# Patient Record
Sex: Female | Born: 1950 | Race: White | Hispanic: No | Smoking: Former smoker
Health system: Southern US, Community
[De-identification: ages and names within clinical notes are randomized; demographics above are authoritative.]

## PROBLEM LIST (undated history)

## (undated) DIAGNOSIS — F1911 Other psychoactive substance abuse, in remission: Secondary | ICD-10-CM

## (undated) DIAGNOSIS — I255 Ischemic cardiomyopathy: Secondary | ICD-10-CM

## (undated) DIAGNOSIS — J439 Emphysema, unspecified: Secondary | ICD-10-CM

## (undated) DIAGNOSIS — Z8619 Personal history of other infectious and parasitic diseases: Secondary | ICD-10-CM

## (undated) DIAGNOSIS — B009 Herpesviral infection, unspecified: Secondary | ICD-10-CM

## (undated) DIAGNOSIS — I2119 ST elevation (STEMI) myocardial infarction involving other coronary artery of inferior wall: Secondary | ICD-10-CM

## (undated) DIAGNOSIS — R9439 Abnormal result of other cardiovascular function study: Secondary | ICD-10-CM

## (undated) DIAGNOSIS — Z9861 Coronary angioplasty status: Secondary | ICD-10-CM

## (undated) DIAGNOSIS — I5040 Unspecified combined systolic (congestive) and diastolic (congestive) heart failure: Secondary | ICD-10-CM

## (undated) DIAGNOSIS — Z87891 Personal history of nicotine dependence: Secondary | ICD-10-CM

## (undated) DIAGNOSIS — Z87898 Personal history of other specified conditions: Secondary | ICD-10-CM

## (undated) DIAGNOSIS — I214 Non-ST elevation (NSTEMI) myocardial infarction: Secondary | ICD-10-CM

## (undated) DIAGNOSIS — F418 Other specified anxiety disorders: Secondary | ICD-10-CM

## (undated) DIAGNOSIS — G4700 Insomnia, unspecified: Secondary | ICD-10-CM

## (undated) DIAGNOSIS — I10 Essential (primary) hypertension: Secondary | ICD-10-CM

## (undated) DIAGNOSIS — Z951 Presence of aortocoronary bypass graft: Secondary | ICD-10-CM

## (undated) DIAGNOSIS — J189 Pneumonia, unspecified organism: Secondary | ICD-10-CM

## (undated) DIAGNOSIS — R570 Cardiogenic shock: Secondary | ICD-10-CM

## (undated) DIAGNOSIS — E785 Hyperlipidemia, unspecified: Secondary | ICD-10-CM

## (undated) DIAGNOSIS — I2510 Atherosclerotic heart disease of native coronary artery without angina pectoris: Secondary | ICD-10-CM

## (undated) HISTORY — DX: Presence of aortocoronary bypass graft: Z95.1

## (undated) HISTORY — DX: Abnormal result of other cardiovascular function study: R94.39

## (undated) HISTORY — DX: Emphysema, unspecified: J43.9

## (undated) HISTORY — DX: Personal history of other specified conditions: Z87.898

## (undated) HISTORY — DX: Other psychoactive substance abuse, in remission: F19.11

## (undated) HISTORY — DX: Unspecified combined systolic (congestive) and diastolic (congestive) heart failure: I50.40

## (undated) HISTORY — DX: Essential (primary) hypertension: I10

## (undated) HISTORY — DX: Personal history of nicotine dependence: Z87.891

## (undated) HISTORY — DX: Personal history of other infectious and parasitic diseases: Z86.19

## (undated) HISTORY — DX: Coronary angioplasty status: Z98.61

## (undated) HISTORY — DX: Other specified anxiety disorders: F41.8

## (undated) HISTORY — DX: Ischemic cardiomyopathy: I25.5

## (undated) HISTORY — DX: Cardiogenic shock: R57.0

## (undated) HISTORY — DX: Non-ST elevation (NSTEMI) myocardial infarction: I21.4

## (undated) HISTORY — PX: DIAGNOSTIC LAPAROSCOPY: SUR761

## (undated) HISTORY — DX: ST elevation (STEMI) myocardial infarction involving other coronary artery of inferior wall: I21.19

## (undated) HISTORY — DX: Atherosclerotic heart disease of native coronary artery without angina pectoris: I25.10

---

## 1998-09-05 ENCOUNTER — Ambulatory Visit (HOSPITAL_COMMUNITY): Admission: RE | Admit: 1998-09-05 | Discharge: 1998-09-05 | Payer: Self-pay | Admitting: Otolaryngology

## 1999-08-17 ENCOUNTER — Encounter: Admission: RE | Admit: 1999-08-17 | Discharge: 1999-08-17 | Payer: Self-pay | Admitting: Internal Medicine

## 1999-09-04 ENCOUNTER — Ambulatory Visit (HOSPITAL_COMMUNITY): Admission: RE | Admit: 1999-09-04 | Discharge: 1999-09-04 | Payer: Self-pay

## 1999-10-31 ENCOUNTER — Emergency Department (HOSPITAL_COMMUNITY): Admission: EM | Admit: 1999-10-31 | Discharge: 1999-10-31 | Payer: Self-pay | Admitting: Internal Medicine

## 2000-01-06 ENCOUNTER — Encounter: Admission: RE | Admit: 2000-01-06 | Discharge: 2000-01-06 | Payer: Self-pay | Admitting: Internal Medicine

## 2000-01-06 ENCOUNTER — Emergency Department (HOSPITAL_COMMUNITY): Admission: EM | Admit: 2000-01-06 | Discharge: 2000-01-07 | Payer: Self-pay | Admitting: Emergency Medicine

## 2000-01-11 ENCOUNTER — Ambulatory Visit (HOSPITAL_COMMUNITY): Admission: RE | Admit: 2000-01-11 | Discharge: 2000-01-11 | Payer: Self-pay | Admitting: Emergency Medicine

## 2000-01-11 ENCOUNTER — Encounter: Payer: Self-pay | Admitting: Emergency Medicine

## 2001-05-03 ENCOUNTER — Other Ambulatory Visit: Admission: RE | Admit: 2001-05-03 | Discharge: 2001-05-03 | Payer: Self-pay | Admitting: Obstetrics and Gynecology

## 2002-03-27 ENCOUNTER — Encounter: Payer: Self-pay | Admitting: Obstetrics and Gynecology

## 2002-03-27 ENCOUNTER — Ambulatory Visit (HOSPITAL_COMMUNITY): Admission: RE | Admit: 2002-03-27 | Discharge: 2002-03-27 | Payer: Self-pay | Admitting: Obstetrics and Gynecology

## 2002-09-05 ENCOUNTER — Other Ambulatory Visit: Admission: RE | Admit: 2002-09-05 | Discharge: 2002-09-05 | Payer: Self-pay | Admitting: Obstetrics and Gynecology

## 2003-10-05 DIAGNOSIS — I2510 Atherosclerotic heart disease of native coronary artery without angina pectoris: Secondary | ICD-10-CM

## 2003-10-05 DIAGNOSIS — Z951 Presence of aortocoronary bypass graft: Secondary | ICD-10-CM

## 2003-10-05 HISTORY — DX: Presence of aortocoronary bypass graft: Z95.1

## 2003-10-05 HISTORY — DX: Atherosclerotic heart disease of native coronary artery without angina pectoris: I25.10

## 2003-10-19 ENCOUNTER — Inpatient Hospital Stay (HOSPITAL_COMMUNITY): Admission: EM | Admit: 2003-10-19 | Discharge: 2003-11-02 | Payer: Self-pay | Admitting: Emergency Medicine

## 2003-10-21 ENCOUNTER — Encounter (INDEPENDENT_AMBULATORY_CARE_PROVIDER_SITE_OTHER): Payer: Self-pay | Admitting: *Deleted

## 2003-10-23 HISTORY — PX: CARDIAC CATHETERIZATION: SHX172

## 2003-10-29 HISTORY — PX: CORONARY ARTERY BYPASS GRAFT: SHX141

## 2003-11-29 ENCOUNTER — Encounter: Admission: RE | Admit: 2003-11-29 | Discharge: 2003-11-29 | Payer: Self-pay | Admitting: Cardiothoracic Surgery

## 2003-12-03 DIAGNOSIS — Z9861 Coronary angioplasty status: Secondary | ICD-10-CM

## 2003-12-03 DIAGNOSIS — I2510 Atherosclerotic heart disease of native coronary artery without angina pectoris: Secondary | ICD-10-CM

## 2003-12-03 DIAGNOSIS — I214 Non-ST elevation (NSTEMI) myocardial infarction: Secondary | ICD-10-CM

## 2003-12-03 HISTORY — DX: Atherosclerotic heart disease of native coronary artery without angina pectoris: Z98.61

## 2003-12-03 HISTORY — DX: Atherosclerotic heart disease of native coronary artery without angina pectoris: I25.10

## 2003-12-03 HISTORY — DX: Non-ST elevation (NSTEMI) myocardial infarction: I21.4

## 2003-12-04 ENCOUNTER — Encounter (HOSPITAL_COMMUNITY): Admission: RE | Admit: 2003-12-04 | Discharge: 2004-03-03 | Payer: Self-pay | Admitting: *Deleted

## 2003-12-13 ENCOUNTER — Inpatient Hospital Stay (HOSPITAL_COMMUNITY): Admission: EM | Admit: 2003-12-13 | Discharge: 2003-12-24 | Payer: Self-pay | Admitting: *Deleted

## 2003-12-13 HISTORY — PX: CARDIAC CATHETERIZATION: SHX172

## 2003-12-16 HISTORY — PX: CARDIAC CATHETERIZATION: SHX172

## 2003-12-18 HISTORY — PX: CARDIAC CATHETERIZATION: SHX172

## 2004-02-02 DIAGNOSIS — R570 Cardiogenic shock: Secondary | ICD-10-CM

## 2004-02-02 DIAGNOSIS — I2119 ST elevation (STEMI) myocardial infarction involving other coronary artery of inferior wall: Secondary | ICD-10-CM

## 2004-02-02 DIAGNOSIS — I255 Ischemic cardiomyopathy: Secondary | ICD-10-CM

## 2004-02-02 HISTORY — DX: ST elevation (STEMI) myocardial infarction involving other coronary artery of inferior wall: I21.19

## 2004-02-02 HISTORY — DX: Cardiogenic shock: R57.0

## 2004-02-02 HISTORY — DX: Ischemic cardiomyopathy: I25.5

## 2004-02-18 ENCOUNTER — Inpatient Hospital Stay (HOSPITAL_COMMUNITY): Admission: EM | Admit: 2004-02-18 | Discharge: 2004-03-03 | Payer: Self-pay | Admitting: Emergency Medicine

## 2004-02-18 HISTORY — PX: CARDIAC CATHETERIZATION: SHX172

## 2004-02-24 HISTORY — PX: CARDIAC CATHETERIZATION: SHX172

## 2004-04-24 IMAGING — CR DG CHEST 2V
2 series · 2 of 2 positions shown · non-contrast
Comparison: none

CLINICAL DATA: Coronary artery disease.  Coronary artery bypass grafts done 10/30/03.  Patient is a smoker.  No present chest complaints. 
 TWO VIEW CHEST
 PA and lateral views of the chest are made and are compared to previous study of 10/31/03 from [HOSPITAL] and show significant interval improvement in the basilar aeration with the left pleural effusion to have completely cleared  there are noted the coronary artery bypass grafts.  Heart appears to be within normal limits.  There is generalized peribronchial thickening compatible with chronic bronchitis. 
 IMPRESSION
 Status post coronary artery bypass grafts, marked interval improvement in basilar aeration, and left effusion.

[view not recorded (1 of 2)]
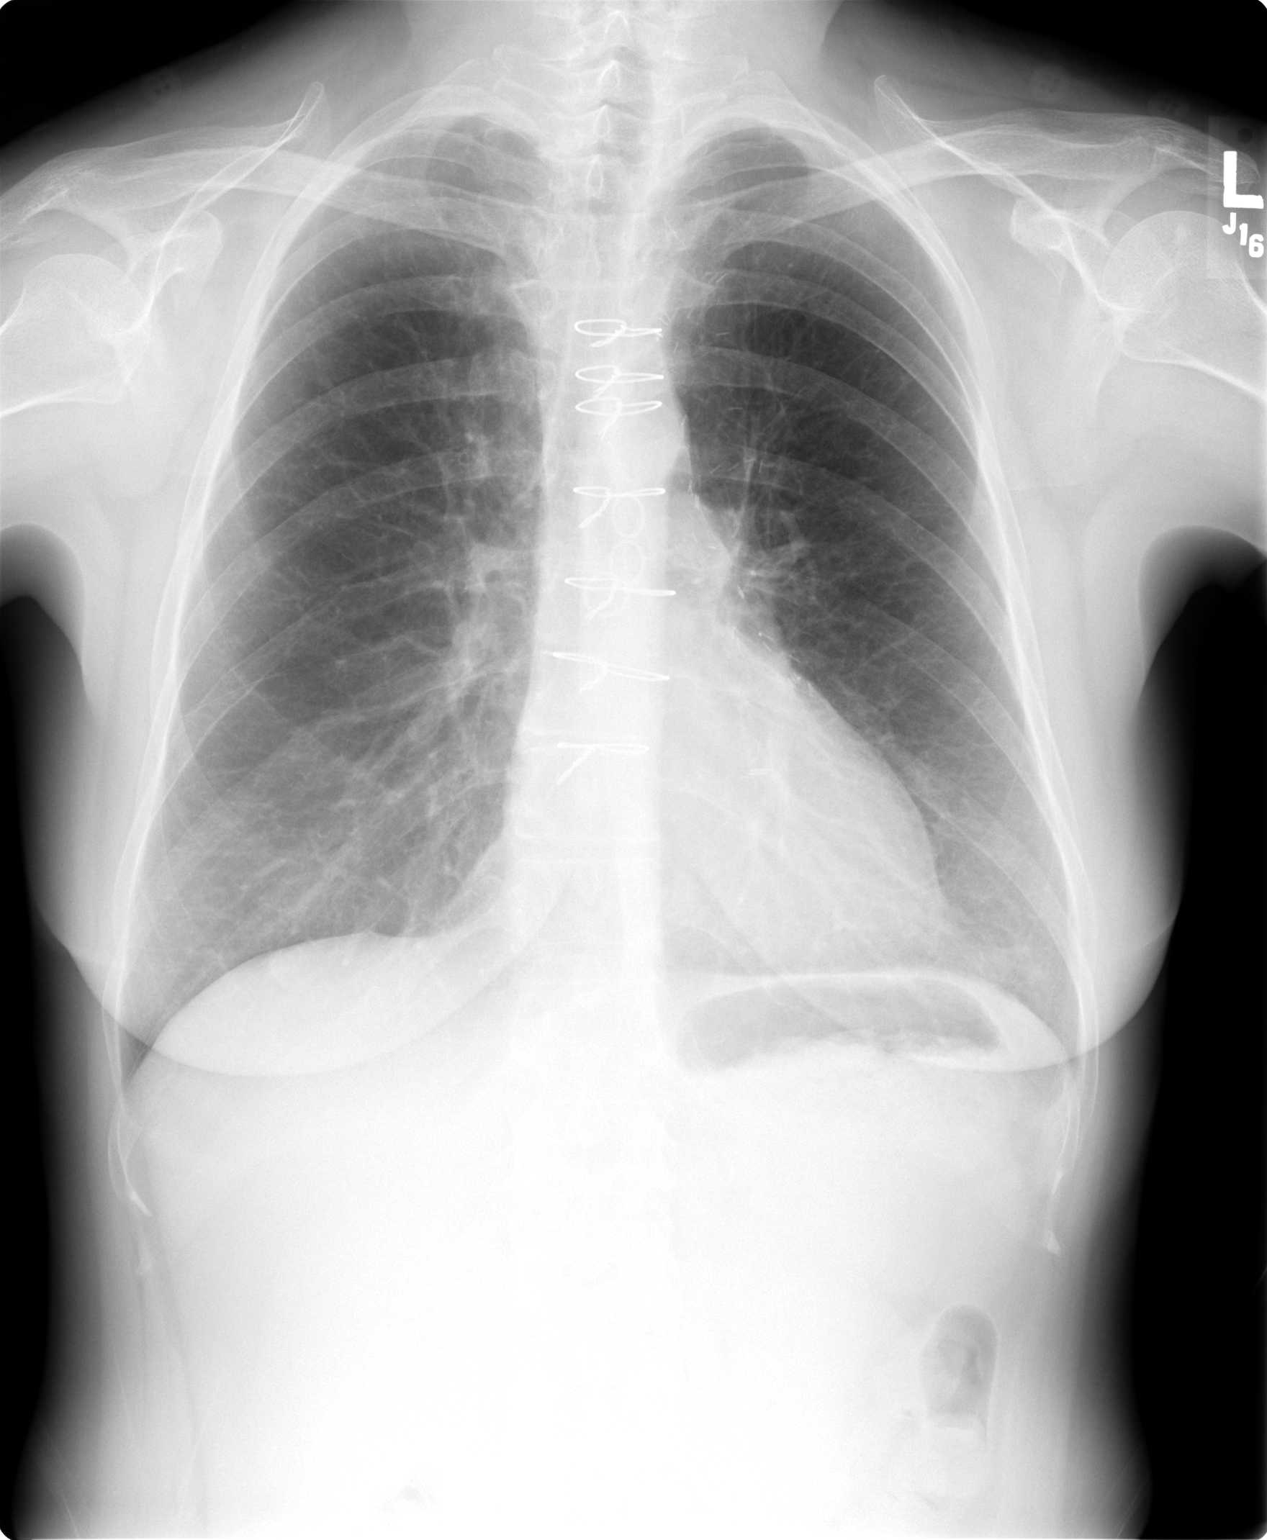

[view not recorded (2 of 2)]
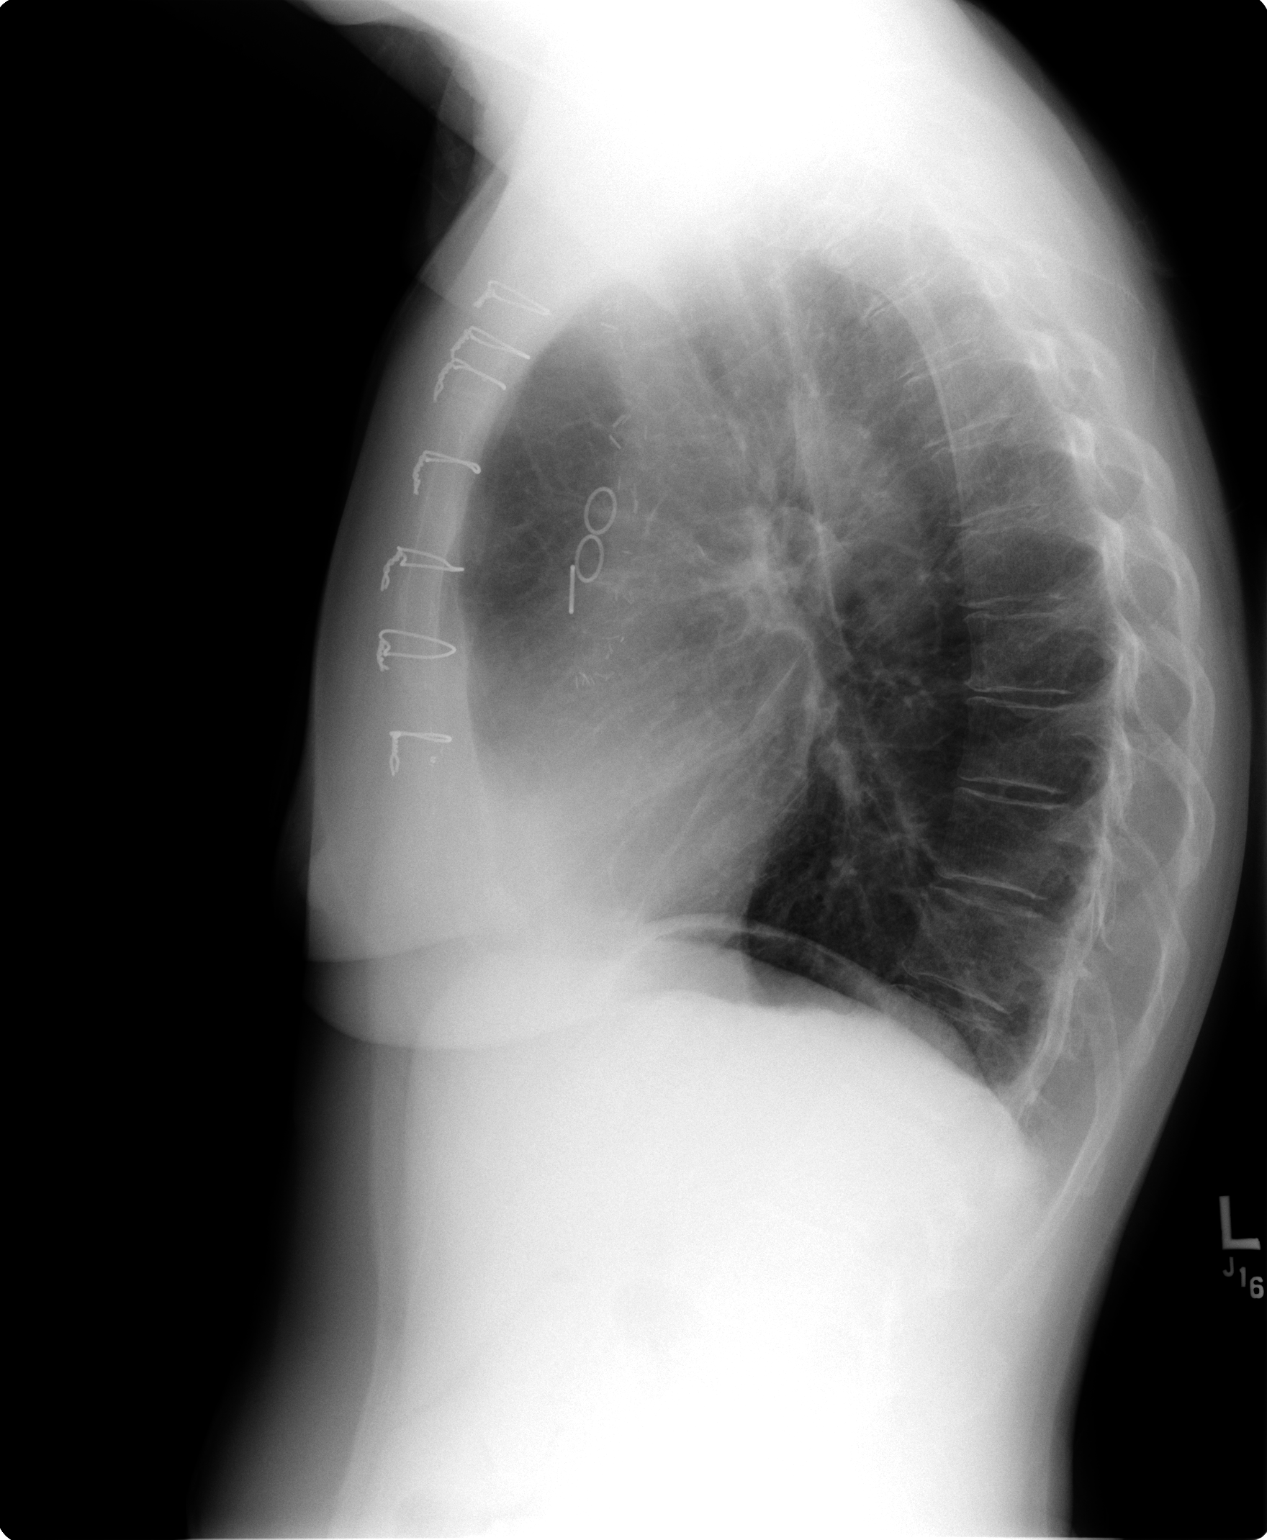

[2 of 2 positions shown; findings below may reference images not displayed]

## 2008-07-25 HISTORY — PX: OTHER SURGICAL HISTORY: SHX169

## 2009-02-11 ENCOUNTER — Ambulatory Visit (HOSPITAL_COMMUNITY): Admission: RE | Admit: 2009-02-11 | Discharge: 2009-02-11 | Payer: Self-pay | Admitting: Gastroenterology

## 2009-03-07 ENCOUNTER — Encounter: Admission: RE | Admit: 2009-03-07 | Discharge: 2009-03-07 | Payer: Self-pay | Admitting: General Surgery

## 2009-04-18 ENCOUNTER — Ambulatory Visit (HOSPITAL_COMMUNITY): Admission: RE | Admit: 2009-04-18 | Discharge: 2009-04-18 | Payer: Self-pay | Admitting: Gastroenterology

## 2009-11-26 ENCOUNTER — Ambulatory Visit (HOSPITAL_COMMUNITY): Admission: RE | Admit: 2009-11-26 | Discharge: 2009-11-26 | Payer: Self-pay | Admitting: Gastroenterology

## 2009-11-27 ENCOUNTER — Encounter: Admission: RE | Admit: 2009-11-27 | Discharge: 2009-11-27 | Payer: Self-pay | Admitting: Family Medicine

## 2010-10-24 ENCOUNTER — Encounter: Payer: Self-pay | Admitting: Obstetrics and Gynecology

## 2011-02-19 NOTE — Cardiovascular Report (Signed)
NAMEFRANCENE, MCERLEAN                     ACCOUNT NO.:  [id number]   MEDICAL RECORD NO.:  [id number]                   PATIENT TYPE:  INP   LOCATION:  2924                                 FACILITY:  [facility]   PHYSICIAN:  Richard A. Alanda Amass, M.D.          DATE OF BIRTH:  [birth year]   DATE OF PROCEDURE:  12/16/2003  DATE OF DISCHARGE:                              CARDIAC CATHETERIZATION   PROCEDURE:  Retrograde central aortic catheterization, right coronary  angiography via Judkins technique, saphenous vein graft angiography, IC  nitroglycerin administration, weight-adjusted heparin, Plavix 150 p.o.,  potassium 40, Pepcid 40 IV, continued integrilin drip, native right coronary  artery cutting balloon atherectomy mid high grade PLA stenosis.   IVUS interrogation RCA Scimed Atlantis pro system, tandem Taxus 2.5/16 DES  stents distal RCA- proximal PLA stenosis, long DES mid RCA 3.0/32 Taxus  stent, recannulization recently occluded (since December 13, 2003), saphenous  vein graft to distal RCA PDA with known graft insertion and PDA stenosis,  balloon dilatation, Angio-Jet thrombectomy, temporary transvenous pacemaker,  DES stent saphenous vein graft - PDA graft insertion across PDA, non-DES  chromium cobalt large stent mid graft and overlapping non-DES large stent  proximal graft stenting.   BRIEF HISTORY:  This 60 year old divorced mother of two with two  grandchildren is an ex-smoker.  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  She  underwent multivessel coronary artery bypass graft by Dr. Donata Clay October 29, 2003 in the setting of severe three-vessel coronary disease.  Unfortunately, she had no veins available.  An LIMA was used for the  LAD  and all vein grafts were cadaver veins with saphenous vein graft to the PDA,  saphenous vein graft to the ramus intermediate and saphenous vein graft to  the diagonal.  She  presented with acute coronary syndrome, non-ST-segment  elevation myocardial infarction.  CPKs in the 2000 range, positive MBs and  was admitted on December 13, 2003.  She underwent angiography by Dr. Allyson Sabal on  December 13, 2003 and was found to have high grade 95% insertion stenosis of  the LIMA to the LAD with 60 and 80% tandem segmental LAD lesions, 95% OD,  95% mid circumflex, tandem 75 and 90 mid acute margin and 90% PLA lesions.  90-95% lesion at the insertion of the vein graft to the PDA extending into  PDA in both directions.  Occluded vein graft to the diagonal and to the  intermediate.   She was seen by Dr. Kathlee Nations Trigt who felt that she was not a candidate  for re-do surgery with no bypass conduits available.  The case was reviewed  with my colleagues including Dr. Allyson Sabal at length and we elected to proceed  with salvage PCI in this setting.  Because of lack of veins, a right arm PIC  line was placed by radiology December 15, 2003.   The  patient was continued on integrilin.  Heparin was held.  She was  continued on aspirin and Plavix and she was brought to the second floor CP  lab in the postabsorptive state.  Informed consent was obtained.  On the day  of the procedure, BUN and creatinine were 5/0.5.  Potassium was low and she  was treated with supplement.   Arterial pressures were monitored throughout the procedure and ranged 110 to  150 mmHg.   The right groin had some minor ecchymosis and bruising and I was able to  enter the CRFA with a single anterior puncture using 18 thin-wall needle and  a 6 French short arterial sheath was inserted.  Native right coronary  angiography was done with 6 [place] side hole guiding catheter.  The patient  was continued on integrilin. She was given weight-adjusted heparin totalling  5000 units, monitoring ACTs which were therapeutic.  She was given  additional 150 of Plavix, 40 of Pepcid IV and 40 of potassium p.o.  Hemoglobin was also monitored  since it was low coming into the procedure.   Native coronary angiography revealed 80-90% mid RCA stenosis, 75-80%  segmental stenosis around the acute margin, tandem and high grade 80%  segmental distal RCA stenosis.  There was some flow to the PDA with a 95%  stenosis proximal to the graft insertion and another 95% after the graft  insertion.  There was no reflux into the graft which was felt to be 100%  occluded.   The graft itself was 100% occluded proximally.   0.014-inch wires were used which were Asahi soft and light wire through the  native RCA procedure.  The RCA was wired beyond the mid PLA lesion.  IVUS  interrogation was done with an Atlantis pro IVUS catheter.  There was  diffuse severe atherosclerotic disease throughout the distal RCA extending  into the PLA branch.  This appeared to be a combination of soft and fibrotic  plaque.  The distal lesion diameter was 1.8 cm proximal to the PLA branch.   The distal reference vessel was approximately 3.2-3.3.  The mid RCA lesions  were 1.6 mm with a reference diameter of 3.4.  The very proximal reference  RCA was 3.6 mm.  This gave [place] a good idea about the diffuse nature of the  disease and reference size of the vessels.  The IVUS was removed which the  patient tolerated well.   The PLA was then treated with cutting atherectomy with a 2.0 x 10 cutting  balloon with dilatation of 5-30 and 6-30.  IC nitroglycerin was given after.  There was a fair result of 80% reduced to less than 30 in the mid PLA and  some spasm proximal to this resolved.   We then stented the distal RCA with a 2.5/12 Taxus stent up to the PDA  origin. There was disease beyond this, so a second 2.5/12 Taxus stent was  deployed.  Both were deployed at 14-16 atmospheres and post dilated with the  balloons.   We then put a long 3.0/32 Taxus stent to cover the tandem mid and lesions around the acute margin.  This was deployed at 11-30 and post dilated at 7-   30.   We then upgraded balloon to 2.75, dilated the distal tandem lesions and then  upgraded the balloon to a 3.25 and dilated the mid stent at 18 atmospheres.   Angiographically, there was good result and good flow throughout the native  coronary artery.  There was still flow into the PDA, but this was restricted  with the stent overriding this.  Prior to this, we had attempted to cross  the PDA through that, but because of the right angle bend of the graft  insertion this was not possible, so it was abandoned.   We then approached the saphenous vein graft to the RCA.  This was intubated  with a side hole right 4 guiding catheter.  Later in the procedure, it was  changed to a nonside hole 6 [place] JR-4 guide.  Combination of wires was  used and finally an Asahi medium 0.014 was used to recannulize the saphenous  vein graft to the PDA.  Buddy wire technique was used.   Dilatation with 2.0 and then 2.5 balloon was not effective in restoring  flow, but we were able to pass another  Asahi light guide wire into the  distal PDA.  We then used the Angio-Jet for extensive thrombectomy of the  PDA after inserted a __________through the CRFV through a 6 French sheath by  modified Seldinger technique without difficulty.   There were 5-6 Angio-Jet runs of 19 to 50 seconds essentially up and down  the graft.  This was effective in restoring flow.  There was a high grade  stenosis as noted at the PDA, PLA area and this was stented with a 2.5/13  Cypher stent.  This was deployed at 12-42, post dilated at 14-28.   There was then high grade stenosis at a right angle bend at the graft.  Another eccentric moderate stenosis of the proximal graft.  The bend of the  graft was not felt to be stable despite all of the above so this was stented  with a long 4.0/18 chromium cobalt Guidant Vision stent which was deployed  at 12-54 and post dilated to 16-47.   There was residual significant stenosis at the  bend despite this and this  may have been part of the culprit.  This required dilatation with a 4.0/8  Quantum balloon and 24 and 18-18 to open this stent up.   There was good result with good flow around the acute margin of this graft.   We then did tandem non-DES 4.0/12 Guidant Vision stents in the proximal  graft to the RCA.  There was irregularity at the ostia.  The stents were  actually a little bit short of the ostia, but good flow and good residual  lumen.  There was good flow throughout the right coronary graft, excellent  result at the insertion site with good filling of the PDA and filling of the  native RCA.   We then did RCA injection native and found good filling of the RCA and  retrograde filling into the distal RCA graft.   The __________final ACT.  Dilatation system was removed.  Final ACT was  greater than 250.  An I-Stat showed a K of 4.3.  H&H of 9.5/28.  The patient tolerated the procedure well and responded quite well to small doses of  Versed totally 4 mg IV for sedation.   This was a very difficult salvage procedure since she has no reoperative  possibilities and there are no clinical conduits.  Hopefully, the native RCA  will stay patent.  I am not sure about the status of the cadaver vein graft  to the PDA.  The PDA is a fairly good size vessel and this will need to be  watched carefully.  She is a candidate  for staged procedure with the  circumflex and decision about intervening in her native LAD and or her LIMA  insertion site.   CATHETERIZATION DIAGNOSES:  1. Arteriosclerotic heart disease.  2. status post multivessel coronary artery bypass graft October 29, 2003 as     outlined above.  3. Cadaver vein grafts all totally occluded on this study, status post SEMI     well preserved left ventricular function acutely.  4. Remote cigarette abuse.  5. 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  No venous     conduits available at the time  of surgery.  6. Hyperlipidemia.  7. Systemic hypertension.  8. Successful salvage right coronary artery/posterior lateral artery cutting     balloon atherectomy.  9. Successful distal right coronary artery tandem DES stenting.  10.      Long DES stenting mid tandem native right coronary artery lesions.  11.      Totally occluded saphenous vein graft to right coronary artery     recannulized treated with Angio-Jet thrombectomy, balloon angioplasty and     subsequent DES stenting at insertion site and non DES stenting in the mid     and proximal graft.  12.      High grade stenosis insertion LIMA to LAD and moderately severe     stenosis segmental proximal left anterior     descending.  13.      Severe stenosis left circumflex.  14.      Ejection fraction approximately 50% at last catheterization December 13, 2003.                                               Richard A. Alanda Amass, M.D.    RAW/MEDQ  D:  12/16/2003  T:  12/17/2003  Job:  [id number]   cc:   Darlin Priestly, M.D.  [phone] [address], [address]  [place]  [place] [address]  Fax: [phone]   Mikey Bussing, M.D.  [address]  [place]  [place] [address]

## 2011-02-19 NOTE — Consult Note (Signed)
[name], [name]                     ACCOUNT NO.:  [id number]   MEDICAL RECORD NO.:  [id number]                   PATIENT TYPE:  INP   LOCATION:  3709                                 FACILITY:  [facility]   PHYSICIAN:  Kerin Perna III, M.D.           DATE OF BIRTH:  Jul 07, 1951   DATE OF CONSULTATION:  10/23/2003  DATE OF DISCHARGE:                                   CONSULTATION   PHYSICIANS:  1. Requesting physician, Darlin Priestly, M.D.  2. Primary care physician, [facility].  3. Consultant, Kerin Perna, M.D.   REASON FOR CONSULTATION:  Severe 3-vessel coronary artery disease with  subendocardial myocardial infarction.   CHIEF COMPLAINT:  Shortness of breath.   HISTORY OF PRESENT ILLNESS:  I was asked to evaluate this 60 year old white  female for potential surgical coronary revascularization for recently  diagnosed severe 3-vessel coronary artery disease. The patient was admitted  to the emergency department on October 18, 2002, with shortness of breath  and low oxygen saturations. She had a productive cough but had no history of  fever. A chest x-ray in the emergency department demonstrated a left lung  infiltrate. Her cardiac enzymes were mildly positive with a CPK-MB of 8.5  nanogram/mL. She was admitted with a diagnosis of pneumonia  and placed on  antibiotics, nebulized bronchodilator therapy. She has been given a dose of  steroids at [facility] 48 hours prior to admission.   Because of the positive cardiac enzymes, a cardiology consultation  was  completed by Dr. Jacinto Halim. He felt the patient probably had some component of  pulmonary edema on her admission which was responsible for the low oxygen  saturation. A 2D echocardiogram  was performed which showed global  hypokinesia without significant mitral regurgitation. A BNP  was checked  which was 150. She underwent  cardiac catheterization today by Dr. Lenise Herald which demonstrated ejection fraction of  40% and left ventricular end  diastolic pressure  of 14 mmHg. She had a proximal 80% LAD stenosis, a 90%  stenosis of the circumflex and a 75% stenosis of the proximal ramus  intermediate. Her right coronary had a 70% stenosis as well.   Based on her 3-vessel coronary artery disease with reduced LV function, she  was felt to be a candidate for surgical revascularization. The patient is  currently stable in her hospital  room on 4 liters of oxygen without chest  pain or shortness of breath.   PAST MEDICAL HISTORY:  1. Chronic obstructive pulmonary disease with active smoking.  2. History of IV drug abuse, heroine, stopped in 1989.  3. Three-vessel coronary artery disease with left ventricular dysfunction.  4. History of alcohol abuse, now free of alcohol.   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 She is single and has a daughter, [name]. She does not  currently drink, but she does smoke1/2  to 1 pack  of cigarettes daily.   FAMILY HISTORY:  Strongly positive for coronary disease. She has 1 sister  and her mother who both have had heart  bypass surgery.   REVIEW OF SYSTEMS:  No recent systemic problems with fever or weight loss.  ENT review is negative for change in vision or difficulty  swallowing.  Cardiac  review is negative for prior known  heart  disease, MI, murmur or  arrhythmia. Pulmonary review is positive for chronic lung disease with  intermittent  episodes of bronchitis treated as an outpatient. She denies  any known  history of nodules or masses on chest x-ray. She denies any  history of thoracic trauma. Abdominal  review is negative for change in  bowel habits, blood per rectum, jaundice or hepatitis. Urologic review is  negative for kidney stones or hematuria. Musculoskeletal review is negative  for fractures or significant soft tissue trauma. She does have some  arthritis in her neck. Endocrine review is negative for  diabetes or thyroid  disease. Vascular review is negative for DVT, TIA or claudication.  Neurologic review is negative for seizure or stroke. She does have some  intermittent  problems with headache and a tremor.   PHYSICAL EXAMINATION:  GENERAL:  She is 5 feet, 2 inches and weighs 125  pounds. General appearance is that of a pleasant middle-aged  white female  in  her hospital  room lying flat in bed following cardiac catheterization,  no distress. She is surrounded by her family.  VITAL SIGNS:  Blood pressure 135/70, pulse 88, sinus temperature 98.4. Room  air saturation is currently unknown, as she is on 4 liters of oxygen with a  saturation of 96%.  HEENT:  Normocephalic, full extraocular movements. Pharynx clear. Dentition  adequate.  NECK:  Without jugular venous distention, mass or carotid bruits.  LYMPH:  Lymphatics reveal no palpable supraclavicular or axillary  adenopathy.  BREASTS:  Examination  is deferred.  LUNGS:  Bilateral expiratory rhonchi.  CARDIAC:  Examination reveals regular rhythm without S3, gallop or murmur.  ABDOMEN:  Soft, nontender without mass.  EXTREMITIES:  No clubbing, cyanosis or edema. Peripheral pulses 2+ in all  extremities.  RECTAL:  Examination is deferred.  SKIN:  Without rash or lesion.  NEUROLOGIC:  Alert and oriented x3 with full motor function.   LABORATORY DATA:  I have reviewed her coronary arteriograms with Dr. Jenne Campus  and agree with his interpretation of 3-vessel coronary artery disease with  moderate reduction in LV function.   RECOMMENDATIONS:  The patient would benefit from surgical revascularization  with bypass grafts to the LAD, ramus, circumflex marginal and posterior  descending artery. Prior to surgery she needs her pulmonary function  optimized and we will proceed with pulmonary function testing and request a  pulmonary  consult. The patient is tentatively scheduled for surgical revascularization of her coronary artery  disease on Tuesday, October 29, 2003. Thank you very much for the consultation.                                               Mikey Bussing, M.D.    PV/MEDQ  D:  10/23/2003  T:  10/23/2003  Job:  [id number]   cc:   CVTS Office   [facility]

## 2011-02-19 NOTE — Cardiovascular Report (Signed)
[name], [name]                     ACCOUNT NO.:  [id number]   MEDICAL RECORD NO.:  [id number]                   PATIENT TYPE:  INP   LOCATION:  3709                                 FACILITY:  [facility]   PHYSICIAN:  Darlin Priestly, M.D.             DATE OF BIRTH:  [birth year]   DATE OF PROCEDURE:  10/23/2003  DATE OF DISCHARGE:                              CARDIAC CATHETERIZATION   PROCEDURE:  1. Left heart catheterization.  2. Coronary angiography.  3. Left ventriculogram.  4. Abdominal aortogram.   CARDIOLOGIST:  Darlin Priestly, M.D.   COMPLICATIONS:  None.   INDICATIONS:  [name] is a 60 year old female with a history of  tobacco use, remote history of heroin use, currently on chronic methadone,  history of ETOH use, history of ongoing tobacco use who was admitted on  October 19, 2003, with increasing shortness of breath.  She did have a 2-D  echocardiogram which revealed moderately decreased LV systolic function of  approximately 30-40% with left bundle noted on her EKG.  She is now referred  for cardiac catheterization to rule out significant disease.   DESCRIPTION OF PROCEDURE:  After giving informed written consent, the  patient was brought to the cardiac catheterization lab where her right and  left groins were shaved, prepped, and draped in the usual sterile fashion.  ECG monitoring was established.  Using modified Seldinger technique, a #6  French sheath was inserted into the right femoral artery. A 6 French  diagnostic catheter was used to perform diagnostic angiography.   CORONARY ANGIOGRAPHY:  1. This revealed a large left main with no significant disease.  2. The LAD is a medium size vessel which courses to the apex as one large     diagonal branch.  The LAD is noted to be diffusely diseased throughout     its proximal and early mid segment of 70%.  The first diagonal is a large     vessel that bifurcates distally and a has a 60% proximal  lesion.  3. The left circumflex is a medium size vessel that courses in the AV groove     and gives rise to obtuse marginal branches.  The AV circumflex is noted     to have a 90% AV groove lesion.  4. The first obtuse marginal is a medium size vessel which bifurcates     distally.  There is a 70% proximal lesion.  5. The second obtuse marginal is a medium size vessel with no significant     disease.  6. The right coronary artery is a large vessel that is dominant and gives     rise to the PDA as well as posterior lateral branch.  The RCA has a 70%     mid vessel and 50% distal stenotic lesion.  The PDA and posterior lateral     branch have no significant disease.   LEFT VENTRICULOGRAM:  The left ventriculogram  reveals moderately depressed  EF of 35-40% with global hypokinesis.   ABDOMINAL AORTOGRAM:  The abdominal aortogram reveals 40% proximal right  renal artery stenosis with normal left renal artery.   HEMODYNAMICS:  Systemic arterial pressures is 164/78, LV systolic pressure  160/90, LVEDP of 14.   CONCLUSION:  1. Significant three vessel coronary artery disease.  2. Mildly depressed left ventricular systolic function  3. 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  4. Systemic hypertension.                                               Darlin Priestly, M.D.    RHM/MEDQ  D:  10/23/2003  T:  10/24/2003  Job:  [id number]   cc:   Lacretia Leigh. Ninetta Lights, M.D.  [address]  [place]  [place] [address]  Fax: [phone]

## 2011-02-19 NOTE — Discharge Summary (Signed)
[name], [name]                     ACCOUNT NO.:  [id number]   MEDICAL RECORD NO.:  [id number]                   PATIENT TYPE:  INP   LOCATION:  2031                                 FACILITY:  [facility]   PHYSICIAN:  Kerin Perna, M.D.               DATE OF BIRTH:  08/04/1951   DATE OF ADMISSION:  10/19/2003  DATE OF DISCHARGE:  11/03/2003                                 DISCHARGE SUMMARY   ADMISSION DIAGNOSIS:  Dyspnea and cough.   PAST MEDICAL HISTORY:  1. COPD with active smoking.  2. 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  3. History of alcohol abuse.   ALLERGIES:  TETRACYCLINE which causes hives, CODEINE which causes angioedema  and itching.   DISCHARGE DIAGNOSES:  1. Chronic obstructive pulmonary disease.  2. History of intravenous drug abuse.  3. 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  4. History of alcohol abuse.  5. Pneumonia.   HISTORY OF PRESENT ILLNESS:  This is a 60 year old white female who  presented to the emergency department on October 18, 2002, with shortness of  breath and low oxygen saturations.  The patient had a productive cough, but  no history of fever.  The patient had been seen at [facility] prior to  admission to the ED for similar symptoms.  A chest x-ray performed in the  emergency department demonstrated a left lung infiltrate.  The patient's  cardiac enzymes were also mildly positive with a CK-MB of 8.5 nanogram/mL.  The patient was admitted with the diagnosis of pneumonia and placed on  antibiotics and nebulizer bronchodilator therapy.  The patient had been  given a dose of steroids at [facility] 48 hours prior to admission.  Due to  the positive cardiac enzymes, a cardiology consultation was performed by Cristy Hilts. Jacinto Halim, M.D.  A two-dimensional echocardiogram was performed which showed  global hypokinesia without significant mitral  regurgitation.  A BNP was 150.  The patient then underwent cardiac catheterization on October 23, 2003, by  Darlin Priestly, M.D. which demonstrated an ejection fraction of 40% as  well as three vessel coronary artery disease.  Dr. Donata Clay of [facility] was then  consulted regarding surgical revascularization.  After examination of the  patient, it was Dr. Zenaida Niece Trigt's opinion that the patient should undergo  coronary artery bypass grafting surgery.  However, prior to surgery, the  patient needed to have her pulmonary function optimized and a pulmonary  consult was obtained.   HOSPITAL COURSE:  The patient was admitted on October 19, 2003, through the  emergency department with a diagnosis of pneumonia which was treated with  Rocephin and Azithromycin. Due to elevated cardiac enzymes, a cardiology  consultation was performed by Dr. Jacinto Halim.  A two-dimensional echocardiogram  was performed which showed global hypokinesia without significant mitral  regurgitation and  a BNP was noted to be elevated at 150.  The patient  underwent cardiac catheterization by Dr. Jenne Campus on October 23, 2003, which  revealed three vessel coronary artery disease and an ejection fraction of  40%. Dr. Donata Clay of CVTS was consulted regarding surgical  revascularization.  After evaluation of the patient, it was his opinion that  the patient should undergo coronary artery bypass grafting after  optimization of her pulmonary function.  The patient was evaluated by  pulmonology and started on Advair, Depo-Medrol, and Guaifenesin.  The  patient's pulmonary status was stabilized and the patient was taken to the  OR on October 29, 2003, for coronary artery bypass graft x4.  Endoscopic  vein harvest was performed on the left thigh, however, the patient's native  vein was not usable and therefore cryovein was used for the graft.  The left  internal mammary artery was grafted to the LAD, cadaveric vein to the ramus,  cadaveric  vein to the PD, and cadaveric vein was grafted to the diagonal.  The patient tolerated the procedure well and was hemodynamically stable  immediately postoperatively.  The patient was extubated without problems and  woke up from anesthesia neurologically intact.  On postoperative day #1, the  patient's vital signs were stable and she was without complaint.  The chest  tubes and invasive lines were discontinued on postoperative day #1 without  complications.  The patient was transferred to unit 2000 and began cardiac  rehab on postoperative day #2.  The patient tolerated this without problems.  The patient has remained stable and has progressed as expected  postoperatively.  On postoperative day #3, the patient was without  complaint, vital signs were stable, and her oxygen saturation was 90% on  room air.  On physical examination, cardiac was regular rate and rhythm.  Lungs were clear to auscultation bilaterally.  The abdomen was soft,  nontender, and nondistended.  There were positive bowel sounds.  The  incisions were healing well and there was no edema present in the  extremities.  The patient is in stable condition and as long as she  continues to progress without problems, will be ready for discharge within  the next one to two days.   LABORATORY DATA:  CBC on November 01, 2003; white count 19.1, hemoglobin  12.7, hematocrit 36.5, platelets 185.  BNP on November 01, 2003; sodium 136,  potassium 3.3, BUN 12, creatinine 0.7, glucose 148.   CONDITION ON DISCHARGE:  Improved.   DISCHARGE MEDICATIONS:  1. The patient is to resume her Methadone HCL 80 mg p.o. daily.  2. Plavix 75 mg p.o. daily.  3. Advair inhaler one puff twice daily.  4. Wellbutrin 150 mg p.o. b.i.d.  5. Toprol XL 50 mg p.o. daily.  6. Altace 2.5 mg p.o. daily.  7. Zocor 20 mg p.o. daily.  8. Aspirin 81 mg p.o. daily.  9. Colace 100 mg two tablets p.o. daily. 10.      Tylox one to two p.o. q.4-6h p.r.n. pain.    ACTIVITY:  No driving, no strenuous activity, and the patient is to continue  daily breathing and walking exercises.   DIET:  Low salt, fat, and cholesterol.   WOUND CARE:  The patient is to shower daily and clean the incisions with  soap and water.  If the incisions become red, swollen, or draining or if the  patient has a fever of 101 degrees Fahrenheit, she is to call the [facility]  Office.  FOLLOW UP:  1. Dr. Donata Clay, the office will call her with that appointment.  It will     be three weeks after discharge.  2. Dr. Jenne Campus two weeks after discharge, the patient is to call for an     appointment.      Pecola Leisure, [place]                      Kerin Perna, M.D.    AY/MEDQ  D:  11/01/2003  T:  11/02/2003  Job:  [id number]   cc:   Darlin Priestly, M.D.  [phone] [address], [address]  [place]  [place] [address]  Fax: [phone]

## 2011-02-19 NOTE — H&P (Signed)
NAMEJESSIAH, WOJNAR                     ACCOUNT NO.:  [id number]   MEDICAL RECORD NO.:  [id number]                   PATIENT TYPE:  INP   LOCATION:  2116                                 FACILITY:  [facility]   PHYSICIAN:  Nanetta Batty, M.D.                DATE OF BIRTH:  Sep 23, 1951   DATE OF ADMISSION:  02/18/2004  DATE OF DISCHARGE:                                HISTORY & PHYSICAL   CHIEF COMPLAINT:  Chest pain and jaw pain.   HISTORY OF PRESENT ILLNESS:  Ms. Downie is a 60 year old female followed  by Dr. Jenne Campus with a history of coronary disease.  She had bypass surgery  October 29, 2003.  Her EF was 40% at that time.  She has a remote history of  IV heroin use, she has been clean since 1990.  Cadaver vein grafts were  used.  Subsequently, she has occluded her vein grafts and also developed an  anastomosis LIMA to LAD stenosis.  She was recently admitted  December 13, 2003 and underwent complex intervention to the native RCA by Dr.  Alanda Amass using two Vision stents and a Cypher stent as well as three Taxus  stents.  She was anemic at that admission and required a transfusion.  Her  EF was 50% at that time.  She was admitted through the emergency room this  morning with recurrent chest pain consistent with unstable angina.  She had  midsternal chest pain which radiated to her jaw and both arms.  This was  noted to improve with nitroglycerin per EMS.  She rated her pain 3/10 on  arrival to the emergency room.   PAST MEDICAL HISTORY:  1. Prior heroin use as noted, she has been clean since 1990 and on     methadone.  2. She has a past history of smoking.  3. She has a history of anxiety/depression.  4. She has treated hyperlipidemia.  5. She has had transient hypotension secondary to medications.   CURRENT MEDICATIONS:  1. Plavix 75 mg a day.  2. Methadone 80 mg a day.  3. Wellbutrin SR 150 twice a day.  4. Protonix 40 mg twice a day.  5. Caduet once a day.  6. Coated  aspirin 81 mg a day.  7. Toprol XL daily.  8. Altace 1.25 mg a day.  9. Imdur 90 mg a day.   She is allergic to TETRACYCLINE and intolerant to CODEINE.   SOCIAL HISTORY:  She is divorced.  She has two children, one daughter is an  ICU nurse at [employer].  The patient lives with her sister.  She  has two grandchildren.  She is an ex-smoker.   FAMILY HISTORY:  Positive for coronary disease and a stroke in her mother  who died at 109.  Father died at 89 with Addison's disease and hypertension  complications.  There is a positive family history of coronary disease and  diabetes in her siblings.   REVIEW OF SYSTEMS:  Essentially unremarkable except for noted above.   PHYSICAL EXAMINATION:  VITAL SIGNS:  126/63, heart rate 73, respirations 20,  temperature 97.9.  GENERAL:  She is a well-developed, well-nourished, somewhat frail female in  no acute distress.  HEENT:  Normocephalic.  Extraocular movements are intact.  Sclera is non-  icteric.  NECK:  Without JVD and without bruit.  CHEST:  Clear to auscultation and percussion.  CARDIAC:  Regular rate and rhythm without murmur, rub, or gallop.  Normal  S1, S2.  ABDOMEN:  Nontender.  No hepatosplenomegaly.  EXTREMITIES:  Without edema.  Distal pulses are intact but faint.  NEUROLOGIC:  Grossly intact.  She is awake, alert, oriented, and  cooperative.  Moves all extremities without obvious deficit.   IMPRESSION:  1. Unstable angina.  2. Known coronary disease with coronary artery bypass grafting December 16, 2003 with subsequent early occlusion of her vein grafts and an occlusion     to the anastomotic site of the LIMA to the LAD with 90% LAD lesion.  3. Subsequent complex intervention to the right coronary artery with     multiple stents December 16, 2003.  4. Remote intravenous drug use, none since 1990, on chronic methadone.  5. Hypertension.  6. Ex-smoker.   PLAN:  The patient was taken emergently to the catheterization  lab by Dr.  Allyson Sabal for catheterization and further evaluation.      Abelino Derrick, P.A.                      Nanetta Batty, M.D.    Lenard Lance  D:  02/18/2004  T:  02/18/2004  Job:  [id number]

## 2011-02-19 NOTE — Cardiovascular Report (Signed)
[name], [name]                     ACCOUNT NO.:  [id number]   MEDICAL RECORD NO.:  [id number]                   PATIENT TYPE:  INP   LOCATION:  2922                                 FACILITY:  [facility]   PHYSICIAN:  Darlin Priestly, M.D.             DATE OF BIRTH:  [birth year]   DATE OF PROCEDURE:  02/24/2004  DATE OF DISCHARGE:                              CARDIAC CATHETERIZATION   PROCEDURES:  1. Right heart catheterization.  2. Left heart catheterization.  3. Coronary angiography.  4. Left ventriculogram.  5. Insertion of intraaortic balloon pump.   ATTENDING PHYSICIAN:  Darlin Priestly, M.D.   COMPLICATIONS:  None.   INDICATIONS:  [name] is a 60 year old female with a history of  coronary artery disease status post coronary bypass surgery in January 2005  with an EF of 40% at that time.  She had history of IV heroin use and we  were unable to use adequate vein graft so she ultimately had cadaveric vein  grafts used.  She did have a LIMA to the LAD.  She was subsequently admitted  in March 2005 where she was noted to have total occlusion of the vein graft  to the diagonal with a patent LIMA to the LAD though she had a 90% lesion at  the insertion of the IMA after the LAD.  The vein graft to the RCA was  subtotally occluded.  Dr. Alanda Amass did stent her native AV groove  circumflex.  He also opened her vein graft to the PLA as well as her native  RCA.  The patient was readmitted on Feb 18, 2004 with acute inferior wall  myocardial infarction.  She underwent repeat catheterization with Dr. Allyson Sabal,  again, revealing a patent LIMA though there was a 90% lesion in the IMA at  the insertion of the LAD.  The LAD had diffuse 70-80% lesion in proximal  territory.  The first diagonal was unprotected and 80% proximal lesion. The  AV groove circumflex stent was patent.  The patient appeared to have  progression of disease proximal to her previously placed Taxus stent as  well  as between the two Taxus stents and in-stent restenosis of the PDA.  She did  undergo placement of three subsequent Taxus stents throughout her distal and  proximal RCA with angioplasty of the in-stent restenosis.  Her EF at the  time was 45-50%.  All her vein grafts were noted to be totally occluded at  the time including the vein graft to the RCA.  She was admitted to the ICU.  However, she remained hypotensive.  On the morning of Feb 24, 2004, she had  acute onset of shortness of breath with pulmonary edema.  She had no chest  pain.  EKG revealed sinus tachycardia with bundle branch block.  The patient  required intubation in the ICU secondary to increasing hypoxia.  She is now  brought for repeat catheterization to reassess  her coronary status.   DESCRIPTION OF OPERATION:  After obtaining informed written consent by the  family, the patient was brought emergently to the cardiac catheterization  lab.  An 8 French venous sheath was inserted in the left femoral vein and a  6 [place] arterial sheath inserted in the left femoral artery.  Next, a 7  [place] Swan-Ganz catheter was then floated into the RA, RV, PA and wedge  position under fluoroscopic guidance and hemodynamic measurement obtained.  6 French diagnostic catheter was then used to perform diagnostic  angiography.  This revealed a medium size left main with no significant  disease.  LAD is a large vessel which coursed to the apex with one diagonal  branch.  The LAD is noted to have diffuse 50% proximal disease prior to the  take off of the first diagonal.  There is long segmental lesion just beyond  the take off of the first diagonal up to 80%.  The first diagonal is a large  vessel which bifurcates distally and has an 80% proximal and mid vessel  stenosis.   The left circumflex is a medium size vessel which courses in the AV groove  and gives rise to two obtuse marginal branches.  There is a stent noted in  the mid AV  groove circumflex which appears to be widely patent.  The first  OM is a small vessel with no significant disease.  The second OM is a medium  size vessel which bifurcates distally and has no significant disease.   There are left-to-right collaterals to the distal PDA and posterior lateral  branch.   The right coronary artery is noted to be totally occluded in its mid segment  within the previously placed stent. As previously stated, there are left-to-  right collaterals to the PDA and posterior lateral branch.   LEFT VENTRICULOGRAM:  Left ventriculogram reveals moderately depressed LV at  30-35% with global hypokinesis.  The function appears to be best preserved  apically.  There does appear to be moderate amount of mitral regurgitation.   HEMODYNAMICS:  RA 15, RV 37/6, PA 35/25, pulmonary capillary wedge pressure  18, systemic arterial pressure 106/78, LV pressure 105/19, LVEDP 20.  Cardiac output 4.8, cardiac index 2.9.  PA saturation 65%, AO saturation 985  on 100% O2 vented.   CONCLUSIONS:  1. Significant two-vessel coronary artery disease with total occlusion of     the mid right coronary artery.  2. Left-to-right collaterals to the apical posterior descending artery and     posterior lateral branch.  3. Moderate-to-severely reduced left ventricular systolic function with wall     motion abnormality as noted above.  4. Moderate mitral regurgitation.  5. Elevated pulmonary capillary wedge pressure with mild pulmonary     hypertension.  6. Elevated LVEDP.  7. Cardiac output 4.8, cardiac index 2.9 by Fick equation.  8. PA saturation 65%, AO saturation 98% on 100% 02.                                               Darlin Priestly, M.D.    RHM/MEDQ  D:  02/24/2004  T:  02/24/2004  Job:  [id number]

## 2011-02-19 NOTE — Cardiovascular Report (Signed)
[name], [name]                     ACCOUNT NO.:  [id number]   MEDICAL RECORD NO.:  [id number]                   PATIENT TYPE:  INP   LOCATION:  2924                                 FACILITY:  [facility]   PHYSICIAN:  Nanetta Batty, M.D.                DATE OF BIRTH:  March 20, 1951   DATE OF PROCEDURE:  12/13/2003  DATE OF DISCHARGE:                              CARDIAC CATHETERIZATION   PROCEDURES PERFORMED:   CARDIOLOGIST:  Nanetta Batty, M.D.   INDICATIONS FOR PROCEDURE:  [name] is a 60 year old white female who  is approximately six weeks status post coronary artery bypass grafting.  She  has a history of hypertension and hyperlipidemia as well as remote illicit  drug use; on methadone currently.  She underwent coronary artery bypass  grafting October 29, 2003 with a LIMA to the LAD, a cadaver vein to the  ramus, cadaver vein to the posterior descending, and to the diagonal branch.  She  saw Dr. Jenne Campus back in the office on February 11th and was doing well.  She awoke this morning with chest pain and presented to [facility]  emergency room.  Her cardiac markers were positive.  There are no  electrocardiographic changes.  She was placed on IV heparin and nitro as  well as Integrilin.  She comes now to the cath lab for angiography and  potential intervention.   PROCEDURE DESCRIPTION:  The patient was brought to the second floor [facility] cardiac cath lab in the post absorptive state.  She was premedicated  with p.o. Valium.  Her right groin was prepped and shaved in the usual  sterile fashion.  One percent Xylocaine was used for local anesthesia.  A 6  French sheath was inserted into the right femoral artery using standard  Seldinger technique.  Six French right and left Judkins diagnostic catheters  as well as a 6 French pigtail catheter and right bypass graft catheter were  used for selective coronary angiography, left ventriculography, selective  IMA  angiography, and selective vein graft angiography respectively.  Omnipaque dye was used for the entirety of the case.  Retrograde aortic and  left ventricular pullback pressures were recorded.   HEMODYNAMIC DATA:  1. Aortic systolic pressure 127, diastolic pressure 69.  2. Left ventricular systolic pressure 126, end-diastolic pressure 5.   ANGIOGRAPHIC DATA:  Selective Coronary Angiography  Left Main:  Left main is normal.   LAD:  The LAD had 60% segmental proximal followed by 70-80% segmental mid  straddling the first diagonal branch, which is a moderate-sized vessel and  had 70% long segmental proximal and mid stenosis.   Left Circumflex:  This vessel had a 95% eccentric stenosis at the junction  of the mid and distal AV groove circ.   Ramus Intermedius Branch:  This is a small-to-medium-sized vessel, which was  diffusely diseased.   Right Coronary Artery:  Dominant vessel with 75% mid followed by  80% distal.  There was a 90% stenosis at the origin of the posterolateral artery.   Vein graft to diagonal was occluded at the aorta.  Vein graft to the ramus  was occluded at the aorta.  Vein graft to the right coronary artery with 90%  insertion stenosis.  LIMA to the LAD; there was a 60% long segmental  stenosis in the mid left subclavian artery straddling the takeoff of the  IMA.  The catheter damped at the origin of the IMA, which had 40-50% long  segmental stenosis.  There was a 95% insertion stenosis at the LAD.  Also of  note was incidentally documented 80% proximal left vertebral artery  stenosis.   VENTRICULOGRAPHIC DATA:  Left Ventriculography:  RAO left ventriculogram was  performed using 20 mL Omnipaque dye at 10 mL per second.  The overall LVEF  ___________ greater than 60% without focal wall motion abnormalities.   IMPRESSION:  [name] unfortunately has severe early graft disease with  two occluded grafts to a diagonal and ramus, and high-grade disease to a   posterior descending artery and left anterior descending with preserved left  ventricular function and positive cardiac markers consistent with non-ST  segment elevation myocardial infarction.   The sheaths will be removed once the ACT falls to 150.  Integrilin will be  continued and heparin will be restarted.  The patient will be started on low-  dose IV nitro.   I will review the films with my colleagues as well as with Dr. Kathlee Nations  Trigt, who has been notified (the performing surgeon).  She may require a  multivessel PCI.   ACT was measured to be greater than 200.  The sheaths were sewn securely in  place.  The patient left the lab in stable condition to an ICU bed.                                               Nanetta Batty, M.D.    JB/MEDQ  D:  12/13/2003  T:  12/14/2003  Job:  [id number]   cc:   Second Floor [facility]   [facility]  [address]  [place], [place]  [address]

## 2011-02-19 NOTE — Op Note (Signed)
NAMESANITA, [name]                     ACCOUNT NO.:  [id number]   MEDICAL RECORD NO.:  [id number]                   PATIENT TYPE:  INP   LOCATION:  2311                                 FACILITY:  [facility]   PHYSICIAN:  Kerin Perna III, M.D.           DATE OF BIRTH:  Jun 27, 1951   DATE OF PROCEDURE:  10/29/2003  DATE OF DISCHARGE:                                 OPERATIVE REPORT   OPERATION:  Coronary artery bypass grafting x 4 (left internal mammary  artery to LAD, saphenous vein graft to diagonal, saphenous vein graft to  ramus intermediate, saphenous vein graft to posterior descending).   PREOPERATIVE DIAGNOSIS:  Class IV congestive heart failure with severe three-  vessel coronary artery disease and moderate reduction in left ventricular  function.   POSTOPERATIVE DIAGNOSIS:  Class IV congestive heart failure with severe  three-vessel coronary artery disease and moderate reduction in left  ventricular function.   ASSISTANT:  Pecola Leisure, PA-C   ANESTHESIA:  General by Dr. Sharee Holster.   INDICATIONS:  The patient is a 60 year old female smoker with significant  COPD and a remote history of IV drug abuse.  She presented in CHF with low  O2 sats.  A 2D echo showed reduced LV function and cardiac catheterization  demonstrated significant three-vessel coronary artery disease.  She was felt  to be a candidate for surgical revascularization.  She was seen  preoperatively by Dr. Sherene Sires of pulmonary medicine, who felt that she was at  high risk for coronary bypass surgery but not contraindicated from her  chronic lung disease.   Prior to surgery I discussed the results of cardiac cath and echo with the  patient and her family.  I discussed the indications and expected benefits  of coronary bypass surgery for treatment of her coronary artery disease.  I  reviewed the alternatives to surgical therapy for treatment of her coronary  artery disease and the expected outcome  of those alternative therapies.  I  discussed with the patient the main aspects of the procedure including the  choice of conduit for grafts, the location of the surgical incisions, the  use of general anesthesia and cardiopulmonary bypass, and the expected  postoperative costs of recovery.  I discussed with her the risks to her of  coronary bypass surgery including risks of MI, CVA, bleeding, blood  transfusion requirement, infection, and death.  She understood these  implications for this surgery and agreed to proceed with the operation as  planned under what I felt was an informed consent.   OPERATIVE FINDINGS:  The patient's saphenous vein was examined from both  thighs and both lower legs and was found to be very thin, small and  inadequate to use as all segments of the vein had thin varicosities which  leaked blood flow under normal systemic pressure.  For that reason,  cryopreserved vein was used of the same blood type as the patient.  The  mammary artery was used and was a small vessel but with excellent flow.  The  heart had mild to moderate global hypokinesia.  Three units of packed cells  were given during bypass to keep the hematocrit at 24% or higher.  The  patient was given one unit of platelets in the operating room at the end of  bypass after protamine was administered for post-pump coagulopathy.   PROCEDURE:  The patient was brought to the operating room and placed supine  on the operating table.  General anesthesia was induced.  The chest, abdomen  and legs were prepped with Betadine and draped as a sterile field.  A  sternal incision was made as the saphenous vein was harvested from both legs  and examined and found to be nonusable.  The mammary artery was harvested as  a pedicle graft from its origin in the subclavian vessels.  Heparin was  administered and the ACT was documented as being therapeutic.  The sternal  retractor was placed.  There were pursestrings placed  in the ascending aorta  and right atrium.  The patient was cannulated and placed on bypass.  The  coronaries were identified for grafting.  The circumflex vessel was too  small to graft.  The mammary artery and cryopreserved vein graft were then  prepared for the distal anastomoses.  A cardioplegia cannula was placed in  the ascending aorta.  The patient was cooled to 30 degrees.  The aortic  cross-clamp was applied; 800 mL of cold blood cardioplegia was delivered to  the aortic root with good cardioplegic arrest and septal temperature dropped  to less than 12 degrees.  Topical iced saline was used to augment myocardial  preservation and a pericardial insulator pad was used to protect the left  phrenic nerve.   The distal coronary anastomoses were then performed.  The first distal  anastomosis was the posterior descending.  This was a small 1.3 mm vessel,  proximal 80% stenosis.  Reverse saphenous vein was sewn end-to-side with  running 7-0 Prolene with good flow through the graft.  The second distal  anastomosis was with ramus intermediate.  This was a 1.4 mm vessel with  proximal 80% stenosis.  Reverse saphenous vein was sewn end-to-side with a  running 7-0 Prolene and good flow through the graft.  Cardioplegia was  redosed.  The third distal anastomosis was the diagonal.  This was a 1.5 mm  vessel with proximal 90% stenosis.  A reverse saphenous vein was sewn end-to-  side with running 7-0 Prolene and good flow through the graft.  The fourth  distal anastomosis was the mid LAD.  This was a 1.5 mm vessel with proximal  90% stenosis.  The left internal mammary artery pedicle was brought through  the opening, created in the left lateral pericardium, was brought down onto  the LAD and sewn end-to-side with running 8-0 Prolene.  There was good flow  through the anastomosis with immediate rise in septal temperature after the release of the pedicle clamp on the mammary artery.  The mammary  pedicle was  secured at the epicardium and the aortic cross-clamp was removed.   The heart was cardioverted back to a regular rhythm.  Using a partial-  occluding clamp, three proximal vein anastomoses were placed on the  ascending aorta using a 4 mm punch and running 6-0 Prolene.  The partial  clamp was removed and the vein grafts were perfused.  Hemostasis was  documented at the proximal  and distal anastomoses.  The patient was rewarmed  and reperfused.  Temporary pacing wires were applied.  The lungs were  expanded.  The ventilator was resumed.  The patient was weaned from bypass  without inotropes with stable blood pressure and cardiac output.  Protamine  was administered without adverse reaction.  The cannulae were removed.  The  mediastinum was irrigated with warm antibiotic irrigation.   The leg incisions were closed in a standard fashion.  The superior  pericardial fat was closed over the aorta and vein grafts.  Two mediastinal  and a left pleural chest tube were placed and brought out through separate  incisions.  The sternum was closed with interrupted steel wire.  The  pectoralis fascia was closed with a running #1 Vicryl.  The subcutaneous and  skin layers were closed with running Vicryl and sterile dressings were  applied.  Total bypass time was 130 minutes with cross-clamp time of 55  minutes.                                               Mikey Bussing, M.D.    PV/MEDQ  D:  10/29/2003  T:  10/30/2003  Job:  [id number]   cc:   Jenne Campus, M.D.  [facility]   [facility] Office

## 2011-02-19 NOTE — Cardiovascular Report (Signed)
[name], [name]                     ACCOUNT NO.:  [id number]   MEDICAL RECORD NO.:  [id number]                   PATIENT TYPE:  INP   LOCATION:  2116                                 FACILITY:  [facility]   PHYSICIAN:  Nanetta Batty, M.D.                DATE OF BIRTH:  June 22, 1951   DATE OF PROCEDURE:  02/18/2004  DATE OF DISCHARGE:                              CARDIAC CATHETERIZATION   INDICATIONS:  Ms. Gunnerson is a 60 year old white female with history of  coronary artery bypass graft October 29, 2003 with cadaveric vein grafts.  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  She was admitted  December 13, 2003 with a non-Q-wave myocardial infarction and underwent cardiac  catheterization by myself revealing two occluded vein grafts, high grade PDA  vein graft disease, high grade native RCA, circumflex, and LAD disease as  well as IMA insertion disease.  Dr. Susa Griffins performed staged  circumflex stenting as well as reconstruction of the PDA vein graft, the  native right and PCI of the IMA insertion.  She was awoken at 5 o'clock this  morning with chest pain and came to [facility] Emergency Room where she was  found to have inferior T-segment elevation.  She was treated with aspirin,  IV heparin, nitro, integrilin and presents now for diagnostic coronary  arteriography.   PROCEDURE DESCRIPTION:  The patient was brought to the second floor [facility] in the postabsorptive state with continuing  chest pain rated at 1/10.  Her right groin was prepped and shaved in the  usual sterile fashion.  1% Xylocaine was used for local anesthesia.  A 6  upgrade to a 7 [place] sheath was inserted into the right femoral artery  using standard Seldinger technique. A 6 French sheath was inserted into the  right femoral vein.  6 French right and left Judkins diagnostic catheter as  well as 6 French pigtail catheter were used for selective coronary  angiography, left ventriculography, vein graft angiography, IMA angiography,  and supravalvular aortography.  Omnipaque dye was used for the entirety of  the case.  Retrograde aorta, left ventricular and pullback pressures were  recorded.   HEMODYNAMICS:  1. Aortic systolic pressure 146, diastolic pressure 75.  2. Left ventricular systolic pressure 145/, end-diastolic pressure 20.   SELECTIVE CORONARY ANGIOGRAPHY:  1. Left main normal.  2. LAD:  The LAD had 60% segmental proximal, followed in tandem by 75%     segmental proximal at the junction of the proximal mid third.  The first     diagonal branch arose in the proximal portion of the LAD and had 80%     segmental proximal stenosis.  There was some competitive flow at the IMA.  3. Left circumflex:  The mid left circumflex stent in the AV groove was     widely patent.  4. Right coronary artery:  Right coronary  artery had 70% segmental proximal     to the previously placed stent, 70-90% segmental at the genu between the     two previously placed stents and 90% at the distal edge of the PLA stent.   Supravalvular aortography revealed all grafts to be occluded.  Attempts at  cannulating the PDA vein graft with a RCB and right Judkins reveal it to be  occluded.   LEFT VENTRICULOGRAPHY:  RAO left ventriculogram was performed using 20 mL of  Omnipaque dye at 10 mL per second.  The overall LVEF is estimated between 45  and 50% with mild inferior hypokinesia.   IMPRESSION:  Ms. Cassells has occluded vein grafts with high grade native  RCA disease as well as LIMA insertion disease.  I believe her culprit lesion  was occlusion of her PDA vein graft leading to a mild inferior wall motion  abnormality.  However, the PDA was a relatively small vessel as is the PLA.  Plans will be for PCI and stenting of the native right coronary artery.   PROCEDURE DESCRIPTION:  The patient received a total of 6500 units of  heparin intravenously.  She will  receive 300 additional mg of p.o. Plavix  and had received aspirin this morning in the ambulance.  The ending ACT was  237.   Using a 7 [place] JR-4  guide catheter with side holes along with an 0.014  190 support guide wire and a 2.5/20 Maverick, PCI was performed on the mid  and distal RCA.  200 mcg of intracoronary artery nitroglycerin was given  several times during the case.  Following this, a 3.0/24 Taxus stent was  deployed in the mid to distal RCA between the two previously placed stents  and deployed at 16 atmospheres.  There was a small uncovered area at the  junction of the distal edge of the Taxus stent which was then covered with a  2.75/8 Taxus.  The distal PLA was dilated with a 2.5/10 cutting balloon at 4  atmospheres and the proximal right was then primarily stented with a 3.0/13  Taxus up to 16 atmospheres.  The previously placed mid 3.0/32 Taxus stent  was post dilated with a 3.25/15 up to 14 atmospheres.   Final angiographic result reveals reduction of proximal 75% to 0, distal 70-  90% to 0 and 90% PLA to 0 with TIMI-3 flow.  The patient tolerated the  procedure well.   OVERALL IMPRESSION:  Successful complex multisite native right coronary  artery percutaneous coronary intervention and stenting using overlapping  Taxus drug-eluting stents and cutting balloon atherectomy.  The patient has  native LAD disease and diagonal disease with a moderately atretic IMA and  high grade insertion stenosis which represents restenosis from the prior  cutting balloon atherectomy performed two months ago.  I believe she would  be a candidate for diagonal LAD PCI and stenting with drug-eluting stents if  this territory becomes ischemic.   The guide wire and catheter were removed.  The sheaths were sewn securely in  place.  Plans will be to remove the sheaths once the ACT falls below 150.  Integrilin will be continued for 24 hours.  The patient will be treated with aspirin and  Plavix.  She left the lab in stable condition.  Nanetta Batty, M.D.    JB/MEDQ  D:  02/18/2004  T:  02/18/2004  Job:  [id number]   cc:   [facility]  [address]  [place], [place] [address]   Darlin Priestly, M.D.  [phone] [address]  [place]  [place] [address]  Fax: [phone]

## 2011-02-19 NOTE — Discharge Summary (Signed)
NAMECRISTIANA, YOCHIM                     ACCOUNT NO.:  [id number]   MEDICAL RECORD NO.:  [id number]                   PATIENT TYPE:  INP   LOCATION:  2012                                 FACILITY:  [facility]   PHYSICIAN:  Nanetta Batty, M.D.                DATE OF BIRTH:  05-06-1951   DATE OF ADMISSION:  12/13/2003  DATE OF DISCHARGE:  12/24/2003                                 DISCHARGE SUMMARY   DISCHARGE DIAGNOSES:  1. Non-ST elevation myocardial infarction.  2. Coronary artery disease.     a. PTCA stent deployment to the distal RCA proximal PLA to the mid RCA        and to the saphenous vein graft to the distal RCA PTA.  Also used was        right coronary cutting balloon arthrectomy mid vessel 12/16/2003.     b. PTCA and stent deployment with CYPHER stent to the high grade mid        circumflex stenosis and PTCA and cutting balloon arthrectomy in the        LIMA insertion.     c. Recent coronary artery bypass grafting October 29, 2003, now with        graft dysfunction.     d. EF 50%.  3. Post procedure anginous resolved currently.  4. Hypotension secondary to medications improved.  5. Remote tobacco abuse.  6. 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  7. Hyperlipidemia.  8. Hypertension stable.  9. 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   DISCHARGE CONDITION:  Improved.   PROCEDURES:  1. 12/16/2003 combined left heart cath with graft visualization, by Dr.     Susa Griffins.  2. 12/16/2003 PTCA stent to the distal RCA, proximal PLA, along DES to the     mid RCA.  Again Taxus balloon dilatation AngioJet thrombectomy to the     saphenous vein graft PDA, graft insertion across the PDA.  Non-DES stent     overlapping with another non-DES large stent proximal graft stenting.  3. 12/16/2003 insertion of temporary pacemaker for procedure only.  4. 12/18/2003 PTCA CYPHER drug-eluding stent to the mid circumflex and PTCA     balloon arthrectomy to the LIMA  insertion site by Dr. Susa Griffins.  5. 12/13/2003 combined left heart cath by Dr. Nanetta Batty with graft     visualization.  6. 12/16/2003 one unit packed cells.  7. 12/19/2003 one unit packed cells.   DISCHARGE MEDICATIONS:  1. Plavix 75 mg 1 daily, do not stop.  2. Methadone 80 mg daily.  3. Wellbutrin SR 150 mg 1 twice a day.  4. Protonix 40 mg 1 twice a day.  5. Zocor 40 mg 1 every evening.  6. Enteric coated aspirin 81 mg daily.  7. Toprol XL 25 mg daily.  8. Altace 1.25 mg daily.  9. Imdur 30 mg daily.  10.      Nitroglycerine sublingual p.r.n.  chest pain.  11.      May use stool softener as needed.  12.      May use Darvocet-N 100 as needed for pain.   DISCHARGE INSTRUCTIONS:  1. No strenuous activity.  2. Low fat, low salt diet.  3. Wash cath site in groin with soap and water, call if any bleeding,     swelling, or drainage.  4. Follow up with Dr. Jenne Campus at 3:15 p.m. on 01/06/2004.   HISTORY OF PRESENT ILLNESS:  Extremely pleasant 60 year old white female was  admitted to [facility] 12/13/2003 by Dr. Nanetta Batty for Dr Jenne Campus  secondary to chest pain.  She has a history of coronary disease and bypass  grafting October 29, 2003 x 4 with a LIMA to the LAD and a cadaver vein  graft.  Other history includes COPD and a history of heroin use, now on  methadone.   The patient was seen and admitted to CCU on IV Heparin and nitroglycerine.  Cardiac markers were positive and she underwent cardiac catheterization  12/13/2003, which revealed two totally occluded grafts to the diag and to  the RI,  the LIMA had a 95% insertion site into the LAD, and the RCA had a  90% stenosis at insertion site, as well as another 90% stenosis.   Plans were made at that time for staged intervention.   PHYSICAL EXAM AT DISCHARGE:  VITAL SIGNS:  Blood pressure 104/55, pulse 89,  respirations 18, temp 97.6. GENERAL:  Alert, oriented white female in no  acute distress, ambulating  in the hall.  HEART:  S1 and S2, Regular rate and  rhythm, normal.  LUNGS:  Clear without rales, rhonchi, or wheezes.  ABDOMEN:  Soft, nontender.  Positive bowel sounds.  EXTREMITIES:  Without edema.   LABORATORY DATA:  Admitting labs, hemoglobin 14.1, hematocrit 40.6, WBC  10.9, platelets 223, neutrophils 65, lymphs 26, mono 7, eos 2, and baso 0.  WBC was at 7 prior to discharge, hemoglobin dropped to a low of 9.9, at  discharge was 11.8, hematocrit 33.9, platelets 238.   Coags on admission:  PT 12.8, INR 0.9, PTT 27.  Heparin less than 0.10.  She  was anticoagulated on Heparin was therapeutic and prior to discharge INR was  1.1.   Chemistry:  Sodium 141, potassium 3.7, chloride 105, CO2 28, glucose 104,  BUN 19, creatinine 0.7, calcium 9.3, magnesium 1.8.  Prior to discharge labs  were stable.  Glucose remains slightly elevated at 115, BUN 23, and  creatinine 0.9.   Cardiac enzymes initially CK 2190, MB 298, and troponin 24.07.  The second  CK 2386, MB 298, and Troponin I of 41, that was the peak.  Came down to a  low of 281 on the 16th with an MB of 20.4, and Troponin I of 5.80.   After chest pain on the 19th, CKs ranged 96-45, but troponins went from  1.22, 1.60, and then down to 0.66.   Lipid panel:  Cholesterol 135, triglycerides 76, HDL 53, LDL 67.   TSH was 1.891.   Iron level was 34, total iron binding capacity 216, iron saturation 16, B12  299, folate 10.6.   Cardiac markers were positive.   Cardiac cath with results as previously stated.   RADIOLOGY:  Admission chest x-ray no evidence of acute cardiopulmonary  disease.   12/15/2003 - PICC placement was done in the right arm and was stable.   12/16/2003 - Right basilar atelectasis and possible small  right effusion.  PICC line in good position, no preliminary edema.   12/17/2003 - Minimal residual right basilar atelectasis, no edema or  infiltrates.  EKGs:  Admission EKG sinus rhythm, left bundle branch block,  abnormal EKG.  On December 14, 2003, sinus rhythm, nonspecific interventricular conduction  delay, bilateral ischemia.  EKGs following remained the same with left  bundle branch block and abnormal EKGs, but no acute changes.   On 12/21/2003 with recurrent chest pain, EKG:  Sinus rhythm, left branch  block but there was no changes.   HOSPITAL COURSE:  A 60 year old white female with recent bypass grafting  including two cadaver vein graft LIMA to the LAD and two saphenous vein  grafts was admitted to [facility] on 12/13/2003 per Dr. Allyson Sabal,  patient  with initially unstable angina, she did in fact have a non-ST elevation MI.  She was taken to the cath lab originally 12/13/2003 and was found to have  graft dysfunction of all four grafts, including the LIMA and the saphenous  vein graft to the RCA.   The two cadaver grafts were totally occluded.   Plans were made staged intervention, patient continued on IV Heparin and  nitroglycerine, and on 12/16/2003 she went to the cath lab with Dr.  Alanda Amass with multiple stents to the RCA saphenous graft and to the native  RCA.   Patient tolerated the procedure, she did become anemic and received one unit  packed cells on 12/16/2003.  On 12/18/2003 she went back to the cath lab for  staged intervention on the LIMA insertion site and to the circumflex.   She tolerated those procedures well, and actually did quite well.  By the  evening of 12/19/2003, she had hypotension, she was given one unit of blood  and p.m. medications had been to be reduced.  Altace was discontinued at  that point and Toprol was cut back.   By the 19th she was gradually improving, IV fluids that she had been given  were cut back to [facility], Altace was restarted at 1.25.  Patient was to transfer  to telemetry bed at that point, but she developed more chest pain but she  was given nitroglycerine and within seconds of giving the nitroglycerine the  pain resolved.  Unsure if there  was a GI component.  Her troponin did  slightly bump up after that episode.  By the next morning she was feeling  well, she continued to walk in the hall, Imdur was added to her medical  regimen, and on 12/24/2003 she was ambulating well.  She is very anxious  about recurrent chest pain.  She was reassured, and explained that at this  point it will be a day-  by-day process.  She will follow-up with Dr. Jenne Campus as an outpatient.  Please note, patient should have a glycohemoglobin done as an outpatient as  her glucose at times here was elevated and mostly due to stress and IV  fluids, but to rule out any diabetes.      Darcella Gasman. Valarie Merino                     Nanetta Batty, M.D.    LRI/MEDQ  D:  12/24/2003  T:  12/26/2003  Job:  [id number]   cc:   Darlin Priestly, M.D.  [address]  [place]  [place] [address]  Fax: [phone]   Mikey Bussing, M.D.  [address]  [place]  [place] [address]  [facility]  [place], [place]

## 2011-02-19 NOTE — Cardiovascular Report (Signed)
[name], [name]                     ACCOUNT NO.:  [id number]   MEDICAL RECORD NO.:  [id number]                   PATIENT TYPE:  INP   LOCATION:  2924                                 FACILITY:  [facility]   PHYSICIAN:  Richard A. Alanda Amass, M.D.          DATE OF BIRTH:  January 15, 1951   DATE OF PROCEDURE:  12/18/2003  DATE OF DISCHARGE:                              CARDIAC CATHETERIZATION   PROCEDURE:  Retrograde central aortic catheterization, selective left  coronary angiography, pre and post IC nitroglycerin administration, weight-  adjusted heparin, continued integrilin infusion, aspirin and Plavix,  predilatation high grade mid circumflex stenosis and subsequent 2.5/13  Cypher drug-eluting stent- successful, selective LIMA angiography pre and  post IC nitroglycerin administration, low pressure balloon percutaneous  transluminal coronary angioplasty and subsequent cutting balloon atherectomy  2.5/10 LIMA insertion - successful, native right coronary angiography,  saphenous vein graft - PDA angiography, LV angiogram RAO, LAO projection.   Please refer to previous catheterization and full report of December 16, 2003.   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  Known coronary disease with three-vessel coronary artery disease,  multivessel coronary artery bypass graft by Dr. Donata Clay October 29, 2003.  Because of absence of venous conduits, LIMA was used and cadaver vein  grafts.  She presented with unstable angina and non-Q-wave myocardial  infarction with CKs greater than 2000, positive MBs on December 13, 2003 and  underwent diagnostic angiography by Dr. Allyson Sabal December 13, 2003 revealing  complex anatomy as outlined with segmental LAD proximal disease, ostial and  proximal DX1 disease, high grade mid circumflex, high grade native right,  distal right and vein graft insertion  into PDA.  Saphenous vein graft to  diagonal and saphenous vein graft to circumflex were occluded.  There was 40-  50% left subclavian stenosis.   The case was reviewed with Dr. Allyson Sabal and Jenne Campus and the patient was turned  down for re-do coronary artery bypass graft because of lack of conduits.  We  then planned for salvage PCI be it complex in this setting.   On December 16, 2003 she had tandem DES stenting of the distal RCA across the  PDA after IVUS interrogation and long DES stenting of the tandem mid RCA  native lesions.  Cutting balloon atherectomy of the mid PLA.   She was found after this to have an occluded graft to the RCA which was  recannulized and treated with rheolytic thrombectomy with Angio-Jet and  subsequent DES stenting of the insertion site to the PDA, the acute bend and  non-DES stenting with a 4.0 large stent chromium cobalt Vision at the acute  margin of the graft and tandem 4.0/12 stents of the proximal portion of the  graft restoring excellent flow.   She remained stable after that and is brought back today for left coronary  angiography and  probable PCI.  She has been continued on integrilin, aspirin  and Plavix and has been stable.   DESCRIPTION OF PROCEDURE:  The left groin was prepped, draped in the usual  manner.  1% Xylocaine was used for local anesthesia.  The LCFA was entered  with an anterior puncture using 18 thin-wall needle and a 6 French short  side arm sheath was inserted.  Left coronary angiography was done pre and  post IC nitroglycerin administration with a 6 French JL-3.5 guiding  catheter.  Weight-adjusted heparin was given at total of 4000 units during  the procedure with continued integrilin infusion, monitoring ACTs which were  therapeutic.  She was given 4 mg of Versed in divided doses for sedation and  intracoronary nitroglycerin of 1000 mcg throughout the procedure.   The circumflex lesion was crossed with a 0.014-inch Asahi soft guide  wire.  The mid circumflex lesion was dilated at low pressure with a 2.25/15 balloon  at 4-30.   There was elastic recoil and suboptimal result and DES stenting was then  done with a 2.5/13 Cypher stent deployed at 9-24 and post dilated at 12-32.  After nitroglycerin IC, stenosis was reduced from 90% eccentric to 0% with  good flow to the distal circumflex artery.   The LIMA was then selectively catheterized with a diagnostic catheter with  several injections obtained and there was dye hang up within the LIMA.  This  was then with high grade stenosis at the LIMA  insertion of 90-95% hazy.  There was associated segmental disease just proximal to the insertion of the  LIMA.  The LIMA was widely patent at this point.   The LIMA had high grade segmental 60-70% proximal disease and tandem before  the diagonal and 80-85% disease beyond the diagonal.  There was good flow to  the distal vessel.  The large diagonal branch had ostial disease of 70-80%  at least, diffuse proximal disease and high grade disease of 80-85% at the  previous saphenous vein graft insertion site before the distal bifurcation  with good vessels beyond this.   There was concern over intervening on the LIMA to the LAD and there was  considerable discussion about whether we should address the native vessel.  Since the LIMA was a good graft except for the insertion site and we  certainly did not want to lose this long term if possible along with the  fact that LAD intervention would require very long probably tandem proximal  LAD stents covering the diagonal.  It was elected to proceed with LIMA  intervention.  This would be done in the general fashion since she is only  60 months postoperative.   The LIMA lesion was crossed with a 0.014-inch Asahi soft wire which was free  in the distal LAD.  The LIMA was then dilated with a 2.25/15 Scimed Maverick balloon at 3-13.  There was residual stenosis at the insertion.  It was  then  redilated again at 8-29 and 10-32.  There was suboptimal result with  haziness and significant elastic recoil particularly at the insertion.  No  evidence of dissection or any perforation.   The balloon was then exchanged for 2.5/10 cutting balloon and the LIMA  across the insertion site was dilated at 4-29 and then 7-36 with good  balloon expansion.  The balloon was pulled back and post nitroglycerin  demonstrated that there was stenosis reduction of 90-95% to less than 30 at  the insertion site with good  flow.  There was significant improvement in  flow through the LIMA into the LAD which was large beyond the graft.  There  was a focal nonflow-limiting dissection in the distal portion of the LIMA  proximal to the insertion site and some mild residual stenosis of about 30-  40%.  Because there was excellent flow and the patient was stable, it was  elected not to pursue any further intervention in this area.  Our thought  was that we try to salvage the LIMA even if we needed to go back later.  The  LIMA was not stented since it was relatively new (two months) graft and also  because it would be very difficult to cover the ostium because of the  insertion angle into LIMA to the LAD completely with stent without  entering  the LAD and we did not want to do that at this point.   Injection showed excellent reflux through the LIMA into the LAD and to the  diagonal branch with good flow.   The system was then removed.   Right coronary angiography was done with a diagnostic 6 French right  coronary demonstrating widely patent native right coronary artery from prior  procedure with widely patent stents, excellent flow to the PLA and  essentially 0% angiographic stenosis.  The mid PLA prior cutting balloon  showed less than 30% narrowing with good flow.   There was some flow to the PDA through the stents which showed stenosis just  before the graft insertion.   There was some  technical difficulty selectively catheterizing the saphenous  vein graft to the PDA and this required a JR-1 diagnostic catheter for  selective injection.  This demonstrated widely patent proximal tandem  stents, widely patent stent at the acute margin and widely patent stent at  the insertion site into the PDA with excellent flow to the PDA and reflux to  the native right coronary.   LV angiogram was then done in the RAO and LAO projection at 25 mL, 14 mL per  second; 20 mL, 12 mL per second demonstrating hypo-akinesis in the mid  anterolateral wall that was mild and hypo-akinesis of the mid posterior wall  that was mild.  EF approximately  50% or greater with no MR.   Catheter was removed.  Side arm sheath was flushed.  The patient was  transferred to the holding area for postoperative care in stable condition.  We planned to continue integrilin for 18 hours, then discontinue this. Continue aspirin and Plavix which she has been on now since admission.   She will be followed up by Dr. Lenise Herald.  It is difficult to tell if  the LIMA insertion cutting balloon intervention will hold up.  At present,  she has significantly improved flow and options include readdressing the  LIMA insertion versus treatment of the proximal segmental LAD and or  diagonal depending upon the clinical and angiographic status.   Outpatient baseline Cardiolite may be helpful in this setting as well.   Continue medical therapy.   CATHETERIZATION DIAGNOSES:  1. Successful mid circumflex high grade stenosis, percutaneous transluminal     angioplasty and subsequent drug-eluting stent.  2. Successful left internal mammary artery insertion, percutaneous     transluminal angioplasty and subsequent cutting balloon atherectomy (see     above).  3. Residual proximal native left anterior descending and diagonal stenosis.  4. Ejection fraction approximately 50%, segmental wall motion abnormalities.  5. Systemic  hypertension.  6.  Hyperlipidemia.  7. Prior complex native right coronary artery and saphenous vein graft to     right coronary artery (cadaver graft intervention December 16, 2003).  See     report.  8. Occluded saphenous vein graft to circumflex and saphenous vein graft to     diagonal.  9. Status post multivessel coronary artery bypass graft October 29, 2003     using left internal mammary artery and     cadaver graft.  10.      Remote heroine use.  No venous conduits available for redo surgery.     On chronic methadone.  11.      Remote smoker.                                               Richard A. Alanda Amass, M.D.    RAW/MEDQ  D:  12/18/2003  T:  12/19/2003  Job:  [id number]   cc:   Darlin Priestly, M.D.  [phone], [address]  [place]  [place] [address]  Fax: [phone]   Mikey Bussing, M.D.  [address]  [place]  [place] [address]

## 2012-01-31 HISTORY — PX: TRANSTHORACIC ECHOCARDIOGRAM: SHX275

## 2012-02-02 DIAGNOSIS — R9439 Abnormal result of other cardiovascular function study: Secondary | ICD-10-CM

## 2012-02-02 HISTORY — DX: Abnormal result of other cardiovascular function study: R94.39

## 2012-02-17 HISTORY — PX: CARDIOVASCULAR STRESS TEST: SHX262

## 2012-04-14 ENCOUNTER — Ambulatory Visit (HOSPITAL_COMMUNITY): Payer: Medicare Other

## 2012-04-14 ENCOUNTER — Encounter (HOSPITAL_COMMUNITY): Payer: Self-pay | Admitting: Gastroenterology

## 2012-04-14 ENCOUNTER — Ambulatory Visit (HOSPITAL_COMMUNITY)
Admission: RE | Admit: 2012-04-14 | Discharge: 2012-04-14 | Disposition: A | Discharge status: Home / Self Care | Payer: Medicare Other | Source: Ambulatory Visit | Attending: Gastroenterology | Admitting: Gastroenterology

## 2012-04-14 ENCOUNTER — Encounter (HOSPITAL_COMMUNITY)
Admission: RE | Disposition: A | Discharge status: Home / Self Care | Payer: Self-pay | Source: Ambulatory Visit | Attending: Gastroenterology

## 2012-04-14 DIAGNOSIS — K838 Other specified diseases of biliary tract: Secondary | ICD-10-CM | POA: Insufficient documentation

## 2012-04-14 DIAGNOSIS — I252 Old myocardial infarction: Secondary | ICD-10-CM | POA: Insufficient documentation

## 2012-04-14 DIAGNOSIS — K829 Disease of gallbladder, unspecified: Secondary | ICD-10-CM | POA: Insufficient documentation

## 2012-04-14 DIAGNOSIS — I10 Essential (primary) hypertension: Secondary | ICD-10-CM | POA: Insufficient documentation

## 2012-04-14 DIAGNOSIS — E785 Hyperlipidemia, unspecified: Secondary | ICD-10-CM | POA: Insufficient documentation

## 2012-04-14 DIAGNOSIS — K8020 Calculus of gallbladder without cholecystitis without obstruction: Secondary | ICD-10-CM | POA: Insufficient documentation

## 2012-04-14 HISTORY — DX: Insomnia, unspecified: G47.00

## 2012-04-14 HISTORY — PX: EUS: SHX5427

## 2012-04-14 HISTORY — DX: Pneumonia, unspecified organism: J18.9

## 2012-04-14 HISTORY — DX: Herpesviral infection, unspecified: B00.9

## 2012-04-14 HISTORY — PX: ESOPHAGOGASTRODUODENOSCOPY: SHX5428

## 2012-04-14 HISTORY — DX: Hyperlipidemia, unspecified: E78.5

## 2012-04-14 SURGERY — UPPER ENDOSCOPIC ULTRASOUND (EUS) LINEAR
Anesthesia: Moderate Sedation

## 2012-04-14 MED ORDER — MIDAZOLAM HCL 10 MG/2ML IJ SOLN
INTRAMUSCULAR | Status: DC | PRN
  Administered 2012-04-14 (×2): 2.5 mg via INTRAVENOUS

## 2012-04-14 MED ORDER — SODIUM CHLORIDE 0.9 % IV SOLN
Freq: Once | INTRAVENOUS | Status: AC
  Administered 2012-04-14: 500 mL via INTRAVENOUS

## 2012-04-14 MED ORDER — MIDAZOLAM HCL 10 MG/2ML IJ SOLN
INTRAMUSCULAR | Status: AC
  Filled 2012-04-14: qty 6

## 2012-04-14 MED ORDER — DIPHENHYDRAMINE HCL 50 MG/ML IJ SOLN
INTRAMUSCULAR | Status: AC
  Filled 2012-04-14: qty 1

## 2012-04-14 MED ORDER — BUTAMBEN-TETRACAINE-BENZOCAINE 2-2-14 % EX AERO
INHALATION_SPRAY | CUTANEOUS | Status: DC | PRN
  Administered 2012-04-14: 2 via TOPICAL

## 2012-04-14 MED ORDER — CIPROFLOXACIN IN D5W 400 MG/200ML IV SOLN
400.0000 mg | Freq: Once | INTRAVENOUS | Status: AC
  Administered 2012-04-14: 400 mg via INTRAVENOUS

## 2012-04-14 MED ORDER — FENTANYL CITRATE 0.05 MG/ML IJ SOLN
INTRAMUSCULAR | Status: DC | PRN
  Administered 2012-04-14 (×2): 25 ug via INTRAVENOUS

## 2012-04-14 MED ORDER — CIPROFLOXACIN IN D5W 400 MG/200ML IV SOLN
INTRAVENOUS | Status: AC
  Filled 2012-04-14: qty 200

## 2012-04-14 MED ORDER — FENTANYL CITRATE 0.05 MG/ML IJ SOLN
INTRAMUSCULAR | Status: AC
  Filled 2012-04-14: qty 6

## 2012-04-14 MED ORDER — GLUCAGON HCL (RDNA) 1 MG IJ SOLR
INTRAMUSCULAR | Status: AC
  Filled 2012-04-14: qty 2

## 2012-04-14 MED ORDER — DIPHENHYDRAMINE HCL 50 MG/ML IJ SOLN
INTRAMUSCULAR | Status: DC | PRN
  Administered 2012-04-14: 25 mg via INTRAVENOUS

## 2012-04-14 NOTE — Op Note (Signed)
[facility] [address] [place], [place]  [address]  ENDOSCOPIC ULTRASOUND PROCEDURE REPORT  PATIENT:  [name], [name]  MR#:  [id number] BIRTHDATE:  05/01/1951  GENDER:  female ENDOSCOPIST:  Jeani Hawking, MD PROCEDURE DATE:  04/14/2012 PROCEDURE:  Upper EUS ASA CLASS:  Class III INDICATIONS:  RUQ pain and Abnormal imaging MEDICATIONS:   Fentanyl 50 mcg IV, Versed 5 mg IV, Benadryl 25 mg IV  DESCRIPTION OF PROCEDURE:   After the risks benefits and alternatives of the procedure were  explained, informed consent was obtained. The patient was then placed in the left, lateral, decubitus postion and IV sedation was administered. Throughout the procedure, the patient's blood pressure, pulse and oxygen saturations were monitored continuously.  Under direct visualization, the 110040 endoscope was introduced through the mouth and advanced to the second portion of the duodenum.  Water was used as necessary to provide an acoustic interface.  Upon completion of the imaging, water was removed and the patient was sent to the recovery room in satisfactory condition. <<PROCEDUREIMAGES>>  FINDINGS:   The CBD was easily identified and it was able to be traced from the ampulla to the hilum of the liver. The CBD was dilated at 10 mm. There was no evidence of any stones, strictures, or masses. There was the possibility of some flecks of sludge. The gallbladder demonstrated an 18 x 19 mm sludge ball versus polyp. This was previously noted on the RUQ U/S. A cursory examination of the pancreas was normal.    The scope was then withdrawn from the patient and the procedure completed.  COMPLICATIONS:  None  ENDOSCOPIC IMPRESSION: 1) Gallbladder sludge ball versus polyp. 2) Dilated CBDat 10 mm without evidence of choledocholthiasis RECOMMENDATIONS: 1) Surgical consultation for cholecystectomy.  ______________________________ Jeani Hawking, MD  n. Rosalie DoctorJeani Hawking at  04/14/2012 12:22 PM  [place] Critchley, [phone]

## 2012-04-14 NOTE — H&P (Signed)
  Reason for Consult:Possible Choledocholithiasis Referring Physician: Jeani Hawking, M.D.  [name] HPI: 61 year old female with RUQ pain.  Her RUQ U/S revealed intra and extrahepatic biliary ductal dilation and her CBD was 9 mm.  Her liver enzymes were ALT 191, AST 355, and AP 153.  She has a sludge ball in her gallbladder.  The repeat liver panel revealed a marked decrease in her liver enzymes.  Past Medical History  Diagnosis Date  . Hypertension   . Myocardial infarction 2005  . Dysrhythmia     afib  . Depression   . Anxiety   . Pneumonia   . Hepatitis A   . Measles   . Chicken pox   . Hyperlipidemia   . Joint pain     both legs  . Herpes   . Insomnia     Past Surgical History  Procedure Date  . Coronary artery bypass graft   . Diagnostic laparoscopy     History reviewed. No pertinent family history.  Social History:  does not have a smoking history on file. She does not have any smokeless tobacco history on file. She reports that she does not drink alcohol or use illicit drugs.  Allergies:  Allergies  Allergen Reactions  . Tetracyclines & Related Rash    Medications: Scheduled:   Continuous:    No results found for this or any previous visit (from the past 24 hour(s)).   No results found.  ROS:  As stated above in the HPI otherwise negative.  There were no vitals taken for this visit.    PE: Gen: NAD, Alert and Oriented HEENT:  [place]/AT, EOMI Neck: Supple, no LAD Lungs: CTA Bilaterally CV: RRR without M/G/R ABM: Soft, NTND, +BS Ext: No C/C/E  Assessment/Plan: 1) ? Choledocholithiasis. 2) Cholelithiasis.   With the drop in her liver enzymes, she may have passed stones.  I will perform an EUS first to see if she has CBD stones before committing to an ERCP.  Plan: EUS+/-ERCP.  [name] D 04/14/2012, 11:16 AM

## 2012-04-17 ENCOUNTER — Encounter (HOSPITAL_COMMUNITY): Payer: Self-pay | Admitting: Gastroenterology

## 2012-05-23 ENCOUNTER — Encounter: Payer: Self-pay | Admitting: Physician Assistant

## 2012-05-23 DIAGNOSIS — F419 Anxiety disorder, unspecified: Secondary | ICD-10-CM | POA: Insufficient documentation

## 2012-05-23 DIAGNOSIS — I2510 Atherosclerotic heart disease of native coronary artery without angina pectoris: Secondary | ICD-10-CM

## 2012-05-23 DIAGNOSIS — I255 Ischemic cardiomyopathy: Secondary | ICD-10-CM | POA: Insufficient documentation

## 2012-05-24 ENCOUNTER — Encounter (INDEPENDENT_AMBULATORY_CARE_PROVIDER_SITE_OTHER): Payer: Self-pay

## 2012-05-24 ENCOUNTER — Ambulatory Visit (INDEPENDENT_AMBULATORY_CARE_PROVIDER_SITE_OTHER): Payer: Medicare Other | Admitting: Surgery

## 2012-05-24 ENCOUNTER — Encounter (INDEPENDENT_AMBULATORY_CARE_PROVIDER_SITE_OTHER): Payer: Self-pay | Admitting: Surgery

## 2012-05-24 VITALS — BP 112/60 | HR 68 | Temp 98.7°F | Resp 16 | Ht 62.0 in | Wt 110.2 lb

## 2012-05-24 DIAGNOSIS — K8050 Calculus of bile duct without cholangitis or cholecystitis without obstruction: Secondary | ICD-10-CM | POA: Insufficient documentation

## 2012-05-24 DIAGNOSIS — K8020 Calculus of gallbladder without cholecystitis without obstruction: Secondary | ICD-10-CM

## 2012-05-24 NOTE — Progress Notes (Signed)
Patient ID: [name], female   DOB: 08-May-1951, 61 y.o.   MRN: [id number]  Chief Complaint  Patient presents with  . Pre-op Exam    eval GB    HPI [name] is a 61 y.o. female.  Patient sent at the request of Dr.Hung for  epigastric pain. She has a history of gallbladder polyp versus done. She underwent an endoscopic ultrasound last month do to common bile duct dilation which showed a polyp versus stone in gallbladder. Last month she had severe epigastric pain and chest pain while the beach. She was evaluated for cardiac disease and this was negative.she is here to discuss possible cholecystectomy. Denies history of fatty food intolerance. She's had a previous attack of pain like this in the past but not as severe. Pain located in the epigastrium and radiates to her chest. Denies nausea and vomiting. HPI  Past Medical History  Diagnosis Date  . Hypertension   . Myocardial infarction 2005  . Dysrhythmia     afib  . Depression   . Anxiety   . Pneumonia   . Hepatitis A   . Measles   . Chicken pox   . Hyperlipidemia   . Joint pain     both legs  . Herpes   . Insomnia   . CHF (congestive heart failure)   . COPD (chronic obstructive pulmonary disease)     Past Surgical History  Procedure Date  . Coronary artery bypass graft   . Diagnostic laparoscopy   . Eus 04/14/2012    Procedure: UPPER ENDOSCOPIC ULTRASOUND (EUS) LINEAR;  Surgeon: Theda Belfast, MD;  Location: [facility] ENDOSCOPY;  Service: Endoscopy;  Laterality: N/A;  . Esophagogastroduodenoscopy 04/14/2012    Procedure: ESOPHAGOGASTRODUODENOSCOPY (EGD);  Surgeon: Theda Belfast, MD;  Location: [facility];  Service: Endoscopy;  Laterality: N/A;    Family History  Problem Relation Age of Onset  . Heart disease Mother   . Stroke Mother   . Heart disease Sister   . Diabetes Sister   . Hypertension Sister   . Diabetes Maternal Aunt     Social History History  Substance Use Topics  . Smoking status:  Former Smoker    Quit date: 10/04/1993  . Smokeless tobacco: Never Used  . Alcohol Use: No    Allergies  Allergen Reactions  . Tetracyclines & Related Rash    Current Outpatient Prescriptions  Medication Sig Dispense Refill  . aspirin 81 MG tablet Take 81 mg by mouth daily.      Marland Kitchen atorvastatin (LIPITOR) 40 MG tablet Take 40 mg by mouth daily.      . carvedilol (COREG) 12.5 MG tablet Take 12.5 mg by mouth 2 (two) times daily with a meal.      . clopidogrel (PLAVIX) 75 MG tablet Take 75 mg by mouth daily.      . digoxin (LANOXIN) 0.05 MG/ML solution Take 0.125 mg by mouth daily.      Marland Kitchen LORazepam (ATIVAN) 0.5 MG tablet       . METHADONE HCL PO Take 60 mg by mouth.      . nitroGLYCERIN (NITROSTAT) 0.3 MG SL tablet Place 0.3 mg under the tongue every 5 (five) minutes as needed.        Review of Systems Review of Systems  Constitutional: Negative.   HENT: Negative.   Eyes: Negative.   Respiratory: Negative.   Cardiovascular: Negative.   Gastrointestinal: Positive for abdominal pain.  Genitourinary: Negative.   Musculoskeletal: Negative.  Neurological: Negative.   Hematological: Negative.   Psychiatric/Behavioral: Negative.     Blood pressure 112/60, pulse 68, temperature 98.7 F (37.1 C), temperature source Temporal, resp. rate 16, height 5\' 2"  (1.575 m), weight 110 lb 3.2 oz (49.986 kg).  Physical Exam Physical Exam  Constitutional: She is oriented to person, place, and time. She appears well-developed and well-nourished.  HENT:  Head: Normocephalic and atraumatic.  Eyes: EOM are normal. Pupils are equal, round, and reactive to light.  Neck: Normal range of motion. Neck supple.  Cardiovascular: Normal rate and regular rhythm.   Pulmonary/Chest: Effort normal and breath sounds normal.  Abdominal: Soft. Bowel sounds are normal. There is no tenderness.  Musculoskeletal: Normal range of motion.  Neurological: She is alert and oriented to person, place, and time.  Skin:  Skin is warm and dry.  Psychiatric: She has a normal mood and affect. Her behavior is normal. Judgment and thought content normal.    Data Reviewed EUS AND U/S ABDOMEN  Assessment    BILIARY COLIC SECONDARY TO POLYP VS STONE IN GALLBLADDER    Plan   LAPAROSCOPIC CHOLECYSTECTOMY AND CHOLANGIOGRAM The procedure has been discussed with the patient. Operative and non operative treatments have been discussed. Risks of surgery include bleeding, infection,  Common bile duct injury,  Injury to the stomach,liver, colon,small intestine, abdominal wall,  Diaphragm,  Major blood vessels,  And the need for an open procedure.  Other risks include worsening of medical problems, death,  DVT and pulmonary embolism, and cardiovascular events.   Medical options have also been discussed. The patient has been informed of long term expectations of surgery and non surgical options,  The patient agrees to proceed.   Success rates for surgery are 80 % for resolution of symptoms.Will need cardiac clearance.       Yovanni Frenette A. 05/24/2012, 12:30 PM

## 2012-05-24 NOTE — Patient Instructions (Signed)
Laparoscopic Cholecystectomy Laparoscopic cholecystectomy is surgery to remove the gallbladder. The gallbladder is located slightly to the right of center in the abdomen, behind the liver. It is a concentrating and storage sac for the bile produced in the liver. Bile aids in the digestion and absorption of fats. Gallbladder disease (cholecystitis) is an inflammation of your gallbladder. This condition is usually caused by a buildup of gallstones (cholelithiasis) in your gallbladder. Gallstones can block the flow of bile, resulting in inflammation and pain. In severe cases, emergency surgery may be required. When emergency surgery is not required, you will have time to prepare for the procedure. Laparoscopic surgery is an alternative to open surgery. Laparoscopic surgery usually has a shorter recovery time. Your common bile duct may also need to be examined and explored. Your caregiver will discuss this with you if he or she feels this should be done. If stones are found in the common bile duct, they may be removed. LET YOUR CAREGIVER KNOW ABOUT:  Allergies to food or medicine.   Medicines taken, including vitamins, herbs, eyedrops, over-the-counter medicines, and creams.   Use of steroids (by mouth or creams).   Previous problems with anesthetics or numbing medicines.   History of bleeding problems or blood clots.   Previous surgery.   Other health problems, including diabetes and kidney problems.   Possibility of pregnancy, if this applies.  RISKS AND COMPLICATIONS All surgery is associated with risks. Some problems that may occur following this procedure include:  Infection.   Damage to the common bile duct, nerves, arteries, veins, or other internal organs such as the stomach or intestines.   Bleeding.   A stone may remain in the common bile duct.  BEFORE THE PROCEDURE  Do not take aspirin for 3 days prior to surgery or blood thinners for 1 week prior to surgery.   Do not eat or  drink anything after midnight the night before surgery.   Let your caregiver know if you develop a cold or other infectious problem prior to surgery.   You should be present 60 minutes before the procedure or as directed.  PROCEDURE  You will be given medicine that makes you sleep (general anesthetic). When you are asleep, your surgeon will make several small cuts (incisions) in your abdomen. One of these incisions is used to insert a small, lighted scope (laparoscope) into the abdomen. The laparoscope helps the surgeon see into your abdomen. Carbon dioxide gas will be pumped into your abdomen. The gas allows more room for the surgeon to perform your surgery. Other operating instruments are inserted through the other incisions. Laparoscopic procedures may not be appropriate when:  There is major scarring from previous surgery.   The gallbladder is extremely inflamed.   There are bleeding disorders or unexpected cirrhosis of the liver.   A pregnancy is near term.   Other conditions make the laparoscopic procedure impossible.  If your surgeon feels it is not safe to continue with a laparoscopic procedure, he or she will perform an open abdominal procedure. In this case, the surgeon will make an incision to open the abdomen. This gives the surgeon a larger view and field to work within. This may allow the surgeon to perform procedures that sometimes cannot be performed with a laparoscope alone. Open surgery has a longer recovery time. AFTER THE PROCEDURE  You will be taken to the recovery area where a nurse will watch and check your progress.   You may be allowed to go home   the same day.   Do not resume physical activities until directed by your caregiver.   You may resume a normal diet and activities as directed.  Document Released: 09/20/2005 Document Revised: 09/09/2011 Document Reviewed: 03/05/2011 [employer] Patient Information 2012 [employer].  Laparoscopic Cholecystectomy Care  After These instructions give you information on caring for yourself after your procedure. Your doctor may also give you more specific instructions. Call your doctor if you have any problems or questions after your procedure. HOME CARE  Change your bandages (dressings) as told by your doctor.   Keep the wound dry and clean. Wash the wound gently with soap and water. Pat the wound dry with a clean towel.   Do not take baths, swim, or use hot tubs for 10 days, or as told by your doctor.   Only take medicine as told by your doctor.   Eat a normal diet as told by your doctor.   Do not lift anything heavier than 25 pounds (11.5 kg), or as told by your doctor.   Do not play contact sports for 1 week, or as told by your doctor.  GET HELP RIGHT AWAY IF:   Your wound is red, puffy (swollen), or painful.   You have yellowish-white fluid (pus) coming from the wound.   You have fluid draining from the wound for more than 1 day.   You have a bad smell coming from the wound.   Your wound breaks open.   You have a rash.   You have trouble breathing.   You have chest pain.   You have a bad reaction to your medicine.   You have a fever.   You have pain in the shoulders (shoulder strap areas).   You feel dizzy or pass out (faint).   You have severe belly (abdominal) pain.   You feel sick to your stomach (nauseous) or throw up (vomit) for more than 1 day.  MAKE SURE YOU:  Understand these instructions.   Will watch your condition.   Will get help right away if you are not doing well or get worse.  Document Released: 06/29/2008 Document Revised: 09/09/2011 Document Reviewed: 03/09/2011 [employer] Patient Information 2012 [employer]. 

## 2012-06-15 ENCOUNTER — Encounter (INDEPENDENT_AMBULATORY_CARE_PROVIDER_SITE_OTHER): Payer: Self-pay

## 2012-06-15 ENCOUNTER — Other Ambulatory Visit (INDEPENDENT_AMBULATORY_CARE_PROVIDER_SITE_OTHER): Payer: Self-pay | Admitting: Surgery

## 2012-06-17 ENCOUNTER — Ambulatory Visit (INDEPENDENT_AMBULATORY_CARE_PROVIDER_SITE_OTHER): Payer: Medicare Other | Admitting: Internal Medicine

## 2012-06-17 ENCOUNTER — Ambulatory Visit: Payer: Medicare Other

## 2012-06-17 VITALS — BP 95/60 | HR 59 | Temp 97.9°F | Resp 16 | Ht 60.75 in | Wt 111.0 lb

## 2012-06-17 DIAGNOSIS — R062 Wheezing: Secondary | ICD-10-CM

## 2012-06-17 DIAGNOSIS — Z91041 Radiographic dye allergy status: Secondary | ICD-10-CM

## 2012-06-17 DIAGNOSIS — R0602 Shortness of breath: Secondary | ICD-10-CM

## 2012-06-17 DIAGNOSIS — J4489 Other specified chronic obstructive pulmonary disease: Secondary | ICD-10-CM

## 2012-06-17 DIAGNOSIS — J449 Chronic obstructive pulmonary disease, unspecified: Secondary | ICD-10-CM

## 2012-06-17 MED ORDER — IPRATROPIUM BROMIDE 0.02 % IN SOLN
0.5000 mg | Freq: Once | RESPIRATORY_TRACT | Status: AC
  Administered 2012-06-17: 0.5 mg via RESPIRATORY_TRACT

## 2012-06-17 MED ORDER — PREDNISONE 10 MG PO TABS: ORAL_TABLET | ORAL | Status: DC

## 2012-06-17 MED ORDER — METHYLPREDNISOLONE ACETATE 80 MG/ML IJ SUSP
80.0000 mg | Freq: Once | INTRAMUSCULAR | Status: AC
  Administered 2012-06-17: 80 mg via INTRAMUSCULAR

## 2012-06-17 MED ORDER — CETIRIZINE HCL 10 MG PO TABS: 10.0000 mg | ORAL_TABLET | Freq: Every day | ORAL | Status: DC

## 2012-06-17 MED ORDER — ALBUTEROL SULFATE HFA 108 (90 BASE) MCG/ACT IN AERS: 2.0000 | INHALATION_SPRAY | Freq: Four times a day (QID) | RESPIRATORY_TRACT | Status: AC | PRN

## 2012-06-17 MED ORDER — ALBUTEROL SULFATE (2.5 MG/3ML) 0.083% IN NEBU
2.5000 mg | INHALATION_SOLUTION | Freq: Once | RESPIRATORY_TRACT | Status: AC
  Administered 2012-06-17: 2.5 mg via RESPIRATORY_TRACT

## 2012-06-17 NOTE — Patient Instructions (Signed)
Chronic Obstructive Pulmonary Disease Chronic obstructive pulmonary disease (COPD) is a condition in which airflow from the lungs is restricted. The lungs can never return to normal, but there are measures you can take which will improve them and make you feel better. CAUSES   Smoking.   Exposure to secondhand smoke.   Breathing in irritants (pollution, cigarette smoke, strong smells, aerosol sprays, paint fumes).   History of lung infections.  TREATMENT  Treatment focuses on making you comfortable (supportive care). Your caregiver may prescribe medications (inhaled or pills) to help improve your breathing. HOME CARE INSTRUCTIONS   If you smoke, stop smoking.   Avoid exposure to smoke, chemicals, and fumes that aggravate your breathing.   Take antibiotic medicines as directed by your caregiver.   Avoid medicines that dry up your system and slow down the elimination of secretions (antihistamines and cough syrups). This decreases respiratory capacity and may lead to infections.   Drink enough water and fluids to keep your urine clear or pale yellow. This loosens secretions.   Use humidifiers at home and at your bedside if they do not make breathing difficult.   Receive all protective vaccines your caregiver suggests, especially pneumococcal and influenza.   Use home oxygen as suggested.   Stay active. Exercise and physical activity will help maintain your ability to do things you want to do.   Eat a healthy diet.  SEEK MEDICAL CARE IF:   You develop pus-like mucus (sputum).   Breathing is more labored or exercise becomes difficult to do.   You are running out of the medicine you take for your breathing.  SEEK IMMEDIATE MEDICAL CARE IF:   You have a rapid heart rate.   You have agitation, confusion, tremors, or are in a stupor (family members may need to observe this).   It becomes difficult to breathe.   You develop chest pain.   You have a fever.  MAKE SURE YOU:    Understand these instructions.   Will watch your condition.   Will get help right away if you are not doing well or get worse.  Document Released: 06/30/2005 Document Revised: 09/09/2011 Document Reviewed: 11/20/2010 [facility] Patient Information 2012 [place], [place].Allergic Rhinitis Allergic rhinitis is when the mucous membranes in the nose respond to allergens. Allergens are particles in the air that cause your body to have an allergic reaction. This causes you to release allergic antibodies. Through a chain of events, these eventually cause you to release histamine into the blood stream (hence the use of antihistamines). Although meant to be protective to the body, it is this release that causes your discomfort, such as frequent sneezing, congestion and an itchy runny nose.  CAUSES  The pollen allergens may come from grasses, trees, and weeds. This is seasonal allergic rhinitis, or "hay fever." Other allergens cause year-round allergic rhinitis (perennial allergic rhinitis) such as house dust mite allergen, pet dander and mold spores.  SYMPTOMS   Nasal stuffiness (congestion).   Runny, itchy nose with sneezing and tearing of the eyes.   There is often an itching of the mouth, eyes and ears.  It cannot be cured, but it can be controlled with medications. DIAGNOSIS  If you are unable to determine the offending allergen, skin or blood testing may find it. TREATMENT   Avoid the allergen.   Medications and allergy shots (immunotherapy) can help.   Hay fever may often be treated with antihistamines in pill or nasal spray forms. Antihistamines block the effects of  histamine. There are over-the-counter medicines that may help with nasal congestion and swelling around the eyes. Check with your caregiver before taking or giving this medicine.  If the treatment above does not work, there are many new medications your caregiver can prescribe. Stronger medications may be used if initial measures  are ineffective. Desensitizing injections can be used if medications and avoidance fails. Desensitization is when a patient is given ongoing shots until the body becomes less sensitive to the allergen. Make sure you follow up with your caregiver if problems continue. SEEK MEDICAL CARE IF:   You develop fever (more than 100.5 F (38.1 C).   You develop a cough that does not stop easily (persistent).   You have shortness of breath.   You start wheezing.   Symptoms interfere with normal daily activities.  Document Released: 06/15/2001 Document Revised: 09/09/2011 Document Reviewed: 12/25/2008 [facility] Patient Information 2012 [place], [place].

## 2012-06-17 NOTE — Progress Notes (Signed)
  Subjective:    Patient ID: [name], female    DOB: [birth year], 61 y.o.   MRN: [id number]  HPI Wheezing and sob gradually getting worse over last 1-2 weeks maybe longer. Hx of copd dx by Dr. Hal Hope, quit smoking 2005 at time of mi. Saw cardiologist last week and heart was stable for her and she was cleared for GB surgery. EKG today LBBB, she has no sxs of her heart pain and she has had mi Last ekg sent to [place] 2011 was right axis with IV delay. Has allergys and cats and other animals.   Review of Systems     Objective:   Physical Exam PFR 125 l/min  Nl 380 Heart RR, no gallup, no murmur No ankle edema Lungs wheezes all fields, decreased bs  [facility] reading (PRIMARY) by  Dr Perrin Maltese emphysematous changes, no acute infiltrate Neb with both improved  pfr post 160 lmin      Assessment & Plan:  Bronchospastic disease/allergys/copd See meds ordered and taught Reck 2d

## 2012-09-01 ENCOUNTER — Emergency Department (HOSPITAL_COMMUNITY): Payer: Medicare Other

## 2012-09-01 ENCOUNTER — Inpatient Hospital Stay (HOSPITAL_COMMUNITY): Payer: Medicare Other

## 2012-09-01 ENCOUNTER — Encounter (HOSPITAL_COMMUNITY): Payer: Self-pay | Admitting: Anesthesiology

## 2012-09-01 ENCOUNTER — Inpatient Hospital Stay (HOSPITAL_COMMUNITY)
Admission: EM | Admit: 2012-09-01 | Discharge: 2012-09-04 | DRG: 419 | Disposition: A | Discharge status: Home / Self Care | Payer: Medicare Other | Attending: Surgery | Admitting: Surgery

## 2012-09-01 ENCOUNTER — Inpatient Hospital Stay (HOSPITAL_COMMUNITY): Payer: Medicare Other | Admitting: Anesthesiology

## 2012-09-01 ENCOUNTER — Encounter (HOSPITAL_COMMUNITY): Admission: EM | Disposition: A | Discharge status: Home / Self Care | Payer: Self-pay | Source: Home / Self Care

## 2012-09-01 ENCOUNTER — Encounter (HOSPITAL_COMMUNITY): Payer: Self-pay

## 2012-09-01 DIAGNOSIS — K812 Acute cholecystitis with chronic cholecystitis: Secondary | ICD-10-CM

## 2012-09-01 DIAGNOSIS — I2510 Atherosclerotic heart disease of native coronary artery without angina pectoris: Secondary | ICD-10-CM | POA: Diagnosis present

## 2012-09-01 DIAGNOSIS — I252 Old myocardial infarction: Secondary | ICD-10-CM

## 2012-09-01 DIAGNOSIS — E785 Hyperlipidemia, unspecified: Secondary | ICD-10-CM | POA: Diagnosis present

## 2012-09-01 DIAGNOSIS — Z833 Family history of diabetes mellitus: Secondary | ICD-10-CM

## 2012-09-01 DIAGNOSIS — R0602 Shortness of breath: Secondary | ICD-10-CM

## 2012-09-01 DIAGNOSIS — R062 Wheezing: Secondary | ICD-10-CM

## 2012-09-01 DIAGNOSIS — K8000 Calculus of gallbladder with acute cholecystitis without obstruction: Principal | ICD-10-CM | POA: Diagnosis present

## 2012-09-01 DIAGNOSIS — Z823 Family history of stroke: Secondary | ICD-10-CM

## 2012-09-01 DIAGNOSIS — J4489 Other specified chronic obstructive pulmonary disease: Secondary | ICD-10-CM | POA: Diagnosis present

## 2012-09-01 DIAGNOSIS — J449 Chronic obstructive pulmonary disease, unspecified: Secondary | ICD-10-CM

## 2012-09-01 DIAGNOSIS — F411 Generalized anxiety disorder: Secondary | ICD-10-CM | POA: Diagnosis present

## 2012-09-01 DIAGNOSIS — I2589 Other forms of chronic ischemic heart disease: Secondary | ICD-10-CM | POA: Diagnosis present

## 2012-09-01 DIAGNOSIS — I10 Essential (primary) hypertension: Secondary | ICD-10-CM | POA: Diagnosis present

## 2012-09-01 DIAGNOSIS — Z87891 Personal history of nicotine dependence: Secondary | ICD-10-CM

## 2012-09-01 DIAGNOSIS — Z91041 Radiographic dye allergy status: Secondary | ICD-10-CM

## 2012-09-01 DIAGNOSIS — Z951 Presence of aortocoronary bypass graft: Secondary | ICD-10-CM

## 2012-09-01 DIAGNOSIS — K8010 Calculus of gallbladder with chronic cholecystitis without obstruction: Secondary | ICD-10-CM | POA: Diagnosis present

## 2012-09-01 DIAGNOSIS — Z8249 Family history of ischemic heart disease and other diseases of the circulatory system: Secondary | ICD-10-CM

## 2012-09-01 HISTORY — PX: CHOLECYSTECTOMY: SHX55

## 2012-09-01 LAB — COMPREHENSIVE METABOLIC PANEL
ALT: 250 U/L — ABNORMAL HIGH (ref 0–35)
AST: 430 U/L — ABNORMAL HIGH (ref 0–37)
Albumin: 3 g/dL — ABNORMAL LOW (ref 3.5–5.2)
Alkaline Phosphatase: 250 U/L — ABNORMAL HIGH (ref 39–117)
BUN: 14 mg/dL (ref 6–23)
CO2: 28 mEq/L (ref 19–32)
Calcium: 9.2 mg/dL (ref 8.4–10.5)
Chloride: 98 mEq/L (ref 96–112)
Creatinine, Ser: 0.55 mg/dL (ref 0.50–1.10)
GFR calc Af Amer: >90 mL/min (ref >90)
GFR calc non Af Amer: >90 mL/min (ref >90)
Glucose, Bld: 138 mg/dL — ABNORMAL HIGH (ref 70–99)
Potassium: 4.3 mEq/L (ref 3.5–5.1)
Sodium: 138 mEq/L (ref 135–145)
Total Bilirubin: 3.7 mg/dL — ABNORMAL HIGH (ref 0.3–1.2)
Total Protein: 7 g/dL (ref 6.0–8.3)
Total Protein: 6.3 g/dL (ref 6.0–8.3)

## 2012-09-01 LAB — CBC WITH DIFFERENTIAL/PLATELET
Lymphocytes Relative: 8 % — ABNORMAL LOW (ref 12–46)
Lymphs Abs: 0.8 10*3/uL (ref 0.7–4.0)
Neutrophils Relative %: 85 % — ABNORMAL HIGH (ref 43–77)
Platelets: 231 10*3/uL (ref 150–400)
RBC: 4.05 MIL/uL (ref 3.87–5.11)
WBC: 10.1 10*3/uL (ref 4.0–10.5)

## 2012-09-01 LAB — SURGICAL PCR SCREEN: MRSA, PCR: NEGATIVE

## 2012-09-01 LAB — URINALYSIS, ROUTINE W REFLEX MICROSCOPIC
Glucose, UA: NEGATIVE mg/dL
Hgb urine dipstick: NEGATIVE
Specific Gravity, Urine: 1.023 (ref 1.005–1.030)
Urobilinogen, UA: 2 mg/dL — ABNORMAL HIGH (ref 0.0–1.0)

## 2012-09-01 LAB — URINE MICROSCOPIC-ADD ON

## 2012-09-01 LAB — POCT I-STAT TROPONIN I

## 2012-09-01 SURGERY — LAPAROSCOPIC CHOLECYSTECTOMY WITH INTRAOPERATIVE CHOLANGIOGRAM
Anesthesia: General | Site: Abdomen | Wound class: Contaminated

## 2012-09-01 MED ORDER — HYDROMORPHONE HCL PF 1 MG/ML IJ SOLN
INTRAMUSCULAR | Status: AC
  Administered 2012-09-01: 1 mg via INTRAVENOUS
  Filled 2012-09-01: qty 1

## 2012-09-01 MED ORDER — HYDROMORPHONE HCL PF 1 MG/ML IJ SOLN
INTRAMUSCULAR | Status: AC
  Administered 2012-09-01: 0.5 mg via INTRAVENOUS
  Filled 2012-09-01: qty 1

## 2012-09-01 MED ORDER — ALBUTEROL SULFATE HFA 108 (90 BASE) MCG/ACT IN AERS
2.0000 | INHALATION_SPRAY | Freq: Four times a day (QID) | RESPIRATORY_TRACT | Status: DC | PRN
  Filled 2012-09-01: qty 6.7

## 2012-09-01 MED ORDER — OXYCODONE-ACETAMINOPHEN 5-325 MG PO TABS
1.0000 | ORAL_TABLET | ORAL | Status: DC | PRN
  Administered 2012-09-02: 1 via ORAL
  Administered 2012-09-02 – 2012-09-04 (×3): 2 via ORAL
  Filled 2012-09-01 (×4): qty 2

## 2012-09-01 MED ORDER — ALBUTEROL SULFATE (5 MG/ML) 0.5% IN NEBU
INHALATION_SOLUTION | RESPIRATORY_TRACT | Status: AC
  Administered 2012-09-01: 2.5 mg via RESPIRATORY_TRACT
  Filled 2012-09-01: qty 0.5

## 2012-09-01 MED ORDER — KCL IN DEXTROSE-NACL 20-5-0.45 MEQ/L-%-% IV SOLN
INTRAVENOUS | Status: AC
  Administered 2012-09-01: 1000 mL
  Filled 2012-09-01: qty 1000

## 2012-09-01 MED ORDER — PANTOPRAZOLE SODIUM 40 MG IV SOLR
40.0000 mg | Freq: Every day | INTRAVENOUS | Status: DC
  Administered 2012-09-01 – 2012-09-03 (×3): 40 mg via INTRAVENOUS
  Filled 2012-09-01 (×4): qty 40

## 2012-09-01 MED ORDER — CARVEDILOL 12.5 MG PO TABS
12.5000 mg | ORAL_TABLET | Freq: Two times a day (BID) | ORAL | Status: DC
  Administered 2012-09-02 – 2012-09-04 (×5): 12.5 mg via ORAL
  Filled 2012-09-01 (×8): qty 1

## 2012-09-01 MED ORDER — ALBUTEROL SULFATE (5 MG/ML) 0.5% IN NEBU
2.5000 mg | INHALATION_SOLUTION | Freq: Four times a day (QID) | RESPIRATORY_TRACT | Status: DC | PRN
  Administered 2012-09-01: 2.5 mg via RESPIRATORY_TRACT

## 2012-09-01 MED ORDER — SODIUM CHLORIDE 0.9 % IV SOLN
INTRAVENOUS | Status: DC | PRN
  Administered 2012-09-01: 16:00:00

## 2012-09-01 MED ORDER — NEOSTIGMINE METHYLSULFATE 1 MG/ML IJ SOLN
INTRAMUSCULAR | Status: DC | PRN
  Administered 2012-09-01: 1 mg via INTRAVENOUS

## 2012-09-01 MED ORDER — DEXTROSE 5 % IV SOLN
1.0000 g | INTRAVENOUS | Status: DC | PRN
  Administered 2012-09-01: 1 g via INTRAVENOUS

## 2012-09-01 MED ORDER — HYDROMORPHONE HCL PF 1 MG/ML IJ SOLN
1.0000 mg | INTRAMUSCULAR | Status: DC | PRN
  Administered 2012-09-01 – 2012-09-03 (×8): 1 mg via INTRAVENOUS
  Filled 2012-09-01 (×7): qty 1

## 2012-09-01 MED ORDER — 0.9 % SODIUM CHLORIDE (POUR BTL) OPTIME
TOPICAL | Status: DC | PRN
  Administered 2012-09-01: 1000 mL

## 2012-09-01 MED ORDER — ONDANSETRON HCL 4 MG/2ML IJ SOLN
INTRAMUSCULAR | Status: AC
  Filled 2012-09-01: qty 2

## 2012-09-01 MED ORDER — NITROGLYCERIN 0.3 MG SL SUBL: 0.3000 mg | SUBLINGUAL_TABLET | SUBLINGUAL | Status: DC | PRN

## 2012-09-01 MED ORDER — ONDANSETRON HCL 4 MG/2ML IJ SOLN
4.0000 mg | Freq: Four times a day (QID) | INTRAMUSCULAR | Status: DC | PRN
  Filled 2012-09-01: qty 2

## 2012-09-01 MED ORDER — ROCURONIUM BROMIDE 100 MG/10ML IV SOLN
INTRAVENOUS | Status: DC | PRN
  Administered 2012-09-01: 25 mg via INTRAVENOUS

## 2012-09-01 MED ORDER — DIGOXIN 125 MCG PO TABS
0.1250 mg | ORAL_TABLET | Freq: Every day | ORAL | Status: DC
  Administered 2012-09-01 – 2012-09-04 (×4): 0.125 mg via ORAL
  Filled 2012-09-01 (×4): qty 1

## 2012-09-01 MED ORDER — LACTATED RINGERS IV SOLN
INTRAVENOUS | Status: DC | PRN
  Administered 2012-09-01 (×2): via INTRAVENOUS

## 2012-09-01 MED ORDER — HYDROMORPHONE HCL PF 1 MG/ML IJ SOLN
0.2500 mg | INTRAMUSCULAR | Status: DC | PRN
  Administered 2012-09-01 (×6): 0.5 mg via INTRAVENOUS

## 2012-09-01 MED ORDER — SODIUM CHLORIDE 0.9 % IR SOLN
Status: DC | PRN
  Administered 2012-09-01: 1000 mL

## 2012-09-01 MED ORDER — PANTOPRAZOLE SODIUM 40 MG IV SOLR
40.0000 mg | Freq: Once | INTRAVENOUS | Status: AC
  Administered 2012-09-01: 40 mg via INTRAVENOUS
  Filled 2012-09-01: qty 40

## 2012-09-01 MED ORDER — ONDANSETRON HCL 4 MG/2ML IJ SOLN
4.0000 mg | Freq: Once | INTRAMUSCULAR | Status: AC | PRN
  Administered 2012-09-01: 4 mg via INTRAVENOUS

## 2012-09-01 MED ORDER — LIDOCAINE HCL (CARDIAC) 20 MG/ML IV SOLN
INTRAVENOUS | Status: DC | PRN
  Administered 2012-09-01: 60 mg via INTRAVENOUS

## 2012-09-01 MED ORDER — KCL IN DEXTROSE-NACL 20-5-0.9 MEQ/L-%-% IV SOLN
INTRAVENOUS | Status: DC
  Administered 2012-09-02: 14:00:00 via INTRAVENOUS
  Filled 2012-09-01 (×7): qty 1000

## 2012-09-01 MED ORDER — MIDAZOLAM HCL 5 MG/5ML IJ SOLN
INTRAMUSCULAR | Status: DC | PRN
  Administered 2012-09-01 (×2): 1 mg via INTRAVENOUS

## 2012-09-01 MED ORDER — ONDANSETRON HCL 4 MG PO TABS: 4.0000 mg | ORAL_TABLET | Freq: Four times a day (QID) | ORAL | Status: DC | PRN

## 2012-09-01 MED ORDER — NITROGLYCERIN 0.4 MG SL SUBL: 0.4000 mg | SUBLINGUAL_TABLET | SUBLINGUAL | Status: DC | PRN

## 2012-09-01 MED ORDER — DEXTROSE 5 % IV SOLN
2.0000 g | Freq: Four times a day (QID) | INTRAVENOUS | Status: DC
  Administered 2012-09-01 – 2012-09-02 (×2): 2 g via INTRAVENOUS
  Filled 2012-09-01 (×5): qty 2

## 2012-09-01 MED ORDER — GLYCOPYRROLATE 0.2 MG/ML IJ SOLN
INTRAMUSCULAR | Status: DC | PRN
  Administered 2012-09-01: 0.2 mg via INTRAVENOUS
  Administered 2012-09-01: 0.4 mg via INTRAVENOUS

## 2012-09-01 MED ORDER — PNEUMOCOCCAL VAC POLYVALENT 25 MCG/0.5ML IJ INJ
0.5000 mL | INJECTION | INTRAMUSCULAR | Status: AC
  Administered 2012-09-02: 0.5 mL via INTRAMUSCULAR
  Filled 2012-09-01: qty 0.5

## 2012-09-01 MED ORDER — ONDANSETRON HCL 4 MG/2ML IJ SOLN
4.0000 mg | Freq: Once | INTRAMUSCULAR | Status: AC
  Administered 2012-09-01: 4 mg via INTRAVENOUS
  Filled 2012-09-01: qty 2

## 2012-09-01 MED ORDER — PHENYLEPHRINE HCL 10 MG/ML IJ SOLN
INTRAMUSCULAR | Status: DC | PRN
  Administered 2012-09-01 (×2): 40 ug via INTRAVENOUS

## 2012-09-01 MED ORDER — INFLUENZA VIRUS VACC SPLIT PF IM SUSP
0.5000 mL | INTRAMUSCULAR | Status: AC
  Administered 2012-09-02: 0.5 mL via INTRAMUSCULAR
  Filled 2012-09-01: qty 0.5

## 2012-09-01 MED ORDER — GI COCKTAIL ~~LOC~~
30.0000 mL | Freq: Once | ORAL | Status: AC
  Administered 2012-09-01: 30 mL via ORAL
  Filled 2012-09-01: qty 30

## 2012-09-01 MED ORDER — HEMOSTATIC AGENTS (NO CHARGE) OPTIME
TOPICAL | Status: DC | PRN
  Administered 2012-09-01: 1 via TOPICAL

## 2012-09-01 MED ORDER — PROPOFOL 10 MG/ML IV BOLUS
INTRAVENOUS | Status: DC | PRN
  Administered 2012-09-01: 100 mg via INTRAVENOUS

## 2012-09-01 MED ORDER — KCL IN DEXTROSE-NACL 20-5-0.9 MEQ/L-%-% IV SOLN
INTRAVENOUS | Status: DC
  Administered 2012-09-01: 1000 mL via INTRAVENOUS
  Administered 2012-09-03 – 2012-09-04 (×3): via INTRAVENOUS
  Filled 2012-09-01 (×8): qty 1000

## 2012-09-01 MED ORDER — DEXTROSE 5 % IV SOLN: 2.0000 g | INTRAVENOUS | Status: DC

## 2012-09-01 MED ORDER — ONDANSETRON HCL 4 MG/2ML IJ SOLN
INTRAMUSCULAR | Status: DC | PRN
  Administered 2012-09-01: 4 mg via INTRAVENOUS

## 2012-09-01 MED ORDER — ONDANSETRON HCL 4 MG/2ML IJ SOLN: 4.0000 mg | Freq: Four times a day (QID) | INTRAMUSCULAR | Status: DC | PRN

## 2012-09-01 MED ORDER — FENTANYL CITRATE 0.05 MG/ML IJ SOLN
INTRAMUSCULAR | Status: DC | PRN
  Administered 2012-09-01 (×2): 75 ug via INTRAVENOUS

## 2012-09-01 MED ORDER — DIGOXIN 0.05 MG/ML PO SOLN
0.1250 mg | Freq: Every day | ORAL | Status: DC
  Filled 2012-09-01: qty 2.5

## 2012-09-01 SURGICAL SUPPLY — 45 items
APPLIER CLIP ROT 10 11.4 M/L (STAPLE) ×2
BLADE SURG ROTATE 9660 (MISCELLANEOUS) IMPLANT
BUPIVICAINE/EPI 1:200,000 30ML ×2 IMPLANT
CANISTER SUCTION 2500CC (MISCELLANEOUS) ×2 IMPLANT
CHLORAPREP W/TINT 26ML (MISCELLANEOUS) ×2 IMPLANT
CLIP APPLIE ROT 10 11.4 M/L (STAPLE) ×1 IMPLANT
CLOTH BEACON ORANGE TIMEOUT ST (SAFETY) ×2 IMPLANT
COVER MAYO STAND STRL (DRAPES) ×2 IMPLANT
COVER SURGICAL LIGHT HANDLE (MISCELLANEOUS) ×2 IMPLANT
DECANTER SPIKE VIAL GLASS SM (MISCELLANEOUS) ×4 IMPLANT
DERMABOND ADVANCED (GAUZE/BANDAGES/DRESSINGS) ×1
DERMABOND ADVANCED .7 DNX12 (GAUZE/BANDAGES/DRESSINGS) ×1 IMPLANT
DRAPE C-ARM 42X72 X-RAY (DRAPES) ×2 IMPLANT
DRAPE UTILITY 15X26 W/TAPE STR (DRAPE) ×4 IMPLANT
DRAPE WARM FLUID 44X44 (DRAPE) ×2 IMPLANT
ELECT REM PT RETURN 9FT ADLT (ELECTROSURGICAL) ×2
ELECTRODE REM PT RTRN 9FT ADLT (ELECTROSURGICAL) ×1 IMPLANT
GLOVE BIO SURGEON STRL SZ7.5 (GLOVE) ×2 IMPLANT
GLOVE BIO SURGEON STRL SZ8 (GLOVE) ×2 IMPLANT
GLOVE BIOGEL PI IND STRL 7.0 (GLOVE) ×1 IMPLANT
GLOVE BIOGEL PI IND STRL 8 (GLOVE) ×2 IMPLANT
GLOVE BIOGEL PI INDICATOR 7.0 (GLOVE) ×1
GLOVE BIOGEL PI INDICATOR 8 (GLOVE) ×2
GLOVE SURG SS PI 7.0 STRL IVOR (GLOVE) ×2 IMPLANT
GOWN EXTRA PROTECTION XL (GOWNS) ×2 IMPLANT
GOWN STRL NON-REIN LRG LVL3 (GOWN DISPOSABLE) ×8 IMPLANT
GOWN STRL REIN 2XL XLG LVL4 (GOWN DISPOSABLE) ×2 IMPLANT
HEMOSTAT SNOW SURGICEL 2X4 (HEMOSTASIS) ×2 IMPLANT
KIT BASIN OR (CUSTOM PROCEDURE TRAY) ×2 IMPLANT
KIT ROOM TURNOVER OR (KITS) ×2 IMPLANT
NS IRRIG 1000ML POUR BTL (IV SOLUTION) ×4 IMPLANT
PAD ARMBOARD 7.5X6 YLW CONV (MISCELLANEOUS) ×2 IMPLANT
POUCH SPECIMEN RETRIEVAL 10MM (ENDOMECHANICALS) ×2 IMPLANT
SCISSORS LAP 5X35 DISP (ENDOMECHANICALS) IMPLANT
SET CHOLANGIOGRAPH 5 50 .035 (SET/KITS/TRAYS/PACK) ×2 IMPLANT
SET IRRIG TUBING LAPAROSCOPIC (IRRIGATION / IRRIGATOR) ×4 IMPLANT
SLEEVE ENDOPATH XCEL 5M (ENDOMECHANICALS) ×2 IMPLANT
SPECIMEN JAR SMALL (MISCELLANEOUS) ×2 IMPLANT
SUT MNCRL AB 4-0 PS2 18 (SUTURE) ×2 IMPLANT
TOWEL OR 17X24 6PK STRL BLUE (TOWEL DISPOSABLE) ×2 IMPLANT
TOWEL OR 17X26 10 PK STRL BLUE (TOWEL DISPOSABLE) ×2 IMPLANT
TRAY LAPAROSCOPIC (CUSTOM PROCEDURE TRAY) ×2 IMPLANT
TROCAR XCEL BLUNT TIP 100MML (ENDOMECHANICALS) ×2 IMPLANT
TROCAR XCEL NON-BLD 11X100MML (ENDOMECHANICALS) ×2 IMPLANT
TROCAR XCEL NON-BLD 5MMX100MML (ENDOMECHANICALS) ×2 IMPLANT

## 2012-09-01 NOTE — Progress Notes (Signed)
Pt. Remains tachypneic despite pain control.  Inspiratory wheezes assessed during auscultation.  Albuterol neb ordered.

## 2012-09-01 NOTE — Anesthesia Procedure Notes (Signed)
Procedure Name: Intubation Date/Time: 09/01/2012 3:24 PM Performed by: Alanda Amass A Pre-anesthesia Checklist: Patient identified, Timeout performed, Emergency Drugs available, Suction available and Patient being monitored Patient Re-evaluated:Patient Re-evaluated prior to inductionOxygen Delivery Method: Circle system utilized Preoxygenation: Pre-oxygenation with 100% oxygen Intubation Type: IV induction Ventilation: Mask ventilation without difficulty Laryngoscope Size: Mac and 3 Grade View: Grade III Tube type: Oral Tube size: 7.5 mm Number of attempts: 1 Airway Equipment and Method: Stylet Placement Confirmation: ETT inserted through vocal cords under direct vision,  breath sounds checked- equal and bilateral and positive ETCO2 Secured at: 21 cm Tube secured with: Tape Dental Injury: Teeth and Oropharynx as per pre-operative assessment

## 2012-09-01 NOTE — ED Notes (Signed)
Pt resting with eyes closed. SpO2 low 90's when resting, 97-98% when awake. Pt placed on 2L O2 [place].

## 2012-09-01 NOTE — Progress Notes (Signed)
Discussed pain plan with Dr. Katrinka Blazing.  Pt. Still c/o 9/10 pain despite dilaudid doses.  Pt. Sleepy at times.  Due to pt history and medication regimen, additional IV dilaudid ordered for an additional 0.5 mg every 10 minutes up to 1 mg.

## 2012-09-01 NOTE — ED Provider Notes (Signed)
History     CSN: [id number]  Arrival date & time 09/01/12  0422   First MD Initiated Contact with Patient 09/01/12 [phone]      Chief Complaint  Patient presents with  . Chest Pain    (Consider location/radiation/quality/duration/timing/severity/associated sxs/prior treatment) HPI Pt states she ate dinner and shortly after started having epigastric pain radiating to back. +nausea. No SOB, no CP. Pt was given ASA and NTG en route. States her pain is improved though now she has a HA. No fever, chills, SOB. No lower ext swelling or pain. History of gallbladder sludge.  Past Medical History  Diagnosis Date  . Hypertension   . Myocardial infarction 2005  . Dysrhythmia     afib  . Depression   . Anxiety   . Pneumonia   . Hepatitis A   . Measles   . Chicken pox   . Hyperlipidemia   . Joint pain     both legs  . Herpes   . Insomnia   . CHF (congestive heart failure)   . COPD (chronic obstructive pulmonary disease)     Past Surgical History  Procedure Date  . Coronary artery bypass graft   . Diagnostic laparoscopy   . Eus 04/14/2012    Procedure: UPPER ENDOSCOPIC ULTRASOUND (EUS) LINEAR;  Surgeon: Theda Belfast, MD;  Location: [facility] ENDOSCOPY;  Service: Endoscopy;  Laterality: N/A;  . Esophagogastroduodenoscopy 04/14/2012    Procedure: ESOPHAGOGASTRODUODENOSCOPY (EGD);  Surgeon: Theda Belfast, MD;  Location: [facility];  Service: Endoscopy;  Laterality: N/A;    Family History  Problem Relation Age of Onset  . Heart disease Mother   . Stroke Mother   . Heart disease Sister   . Diabetes Sister   . Hypertension Sister   . Diabetes Maternal Aunt     History  Substance Use Topics  . Smoking status: Former Smoker    Quit date: 10/04/1993  . Smokeless tobacco: Never Used  . Alcohol Use: No    OB History    Grav Para Term Preterm Abortions TAB SAB Ect Mult Living                  Review of Systems  Constitutional: Negative for fever and chills.  HENT: Negative  for neck pain.   Respiratory: Negative for shortness of breath and wheezing.   Cardiovascular: Negative for chest pain.  Gastrointestinal: Positive for nausea, vomiting and abdominal pain. Negative for diarrhea, constipation and abdominal distention.  Genitourinary: Negative for dysuria, frequency, hematuria and flank pain.  Musculoskeletal: Negative for myalgias and back pain.  Skin: Negative for rash and wound.  Neurological: Negative for dizziness, syncope, weakness, light-headedness, numbness and headaches.    Allergies  Tetracyclines & related  Home Medications   Current Outpatient Rx  Name  Route  Sig  Dispense  Refill  . ALBUTEROL SULFATE HFA 108 (90 BASE) MCG/ACT IN AERS   Inhalation   Inhale 2 puffs into the lungs every 6 (six) hours as needed for wheezing.   1 Inhaler   3   . AMOXICILLIN 500 MG PO CAPS   Oral   Take 500 mg by mouth 3 (three) times daily.         . ASPIRIN 81 MG PO TABS   Oral   Take 81 mg by mouth daily.         . ATORVASTATIN CALCIUM 40 MG PO TABS   Oral   Take 40 mg by mouth daily.         Marland Kitchen  CARVEDILOL 12.5 MG PO TABS   Oral   Take 12.5 mg by mouth 2 (two) times daily with a meal.         . CETIRIZINE HCL 10 MG PO TABS   Oral   Take 1 tablet (10 mg total) by mouth daily.   30 tablet   11   . CITALOPRAM HYDROBROMIDE 20 MG PO TABS   Oral   Take 20 mg by mouth daily.         Marland Kitchen CLOPIDOGREL BISULFATE 75 MG PO TABS   Oral   Take 75 mg by mouth daily.         Marland Kitchen DIGOXIN 0.05 MG/ML PO SOLN   Oral   Take 0.125 mg by mouth daily.         Marland Kitchen LORAZEPAM 0.5 MG PO TABS   Oral   Take 0.5 mg by mouth every 6 (six) hours as needed. For anxiety         . METHADONE HCL PO   Oral   Take 60 mg by mouth.         Marland Kitchen NITROGLYCERIN 0.3 MG SL SUBL   Sublingual   Place 0.3 mg under the tongue every 5 (five) minutes as needed.         Marland Kitchen OMEPRAZOLE MAGNESIUM 20 MG PO TBEC   Oral   Take 20 mg by mouth daily.           BP  144/58  Pulse 75  Temp 98.6 F (37 C) (Oral)  Resp 26  SpO2 97%  Physical Exam  Nursing note and vitals reviewed. Constitutional: She is oriented to person, place, and time. She appears well-developed and well-nourished. No distress.  HENT:  Head: Normocephalic and atraumatic.  Mouth/Throat: Oropharynx is clear and moist.  Eyes: EOM are normal. Pupils are equal, round, and reactive to light.  Neck: Normal range of motion. Neck supple.  Cardiovascular: Normal rate and regular rhythm.   Pulmonary/Chest: Effort normal and breath sounds normal. No respiratory distress. She has no wheezes. She has no rales. She exhibits no tenderness.  Abdominal: Soft. Bowel sounds are normal. She exhibits no distension and no mass. There is tenderness (epigastrict TTP. Pain reproduced completely. no rebound or guarding. ). There is no rebound and no guarding.  Musculoskeletal: Normal range of motion. She exhibits no edema and no tenderness.       No calf swelling or tenderness  Neurological: She is alert and oriented to person, place, and time.  Skin: Skin is warm and dry. No rash noted. No erythema.  Psychiatric: She has a normal mood and affect. Her behavior is normal.    ED Course  Procedures (including critical care time)   Labs Reviewed  CBC WITH DIFFERENTIAL  COMPREHENSIVE METABOLIC PANEL  LIPASE, BLOOD  URINALYSIS, ROUTINE W REFLEX MICROSCOPIC   No results found.   No diagnosis found.   Date: 09/01/2012  Rate: 74  Rhythm: normal sinus rhythm  QRS Axis: normal  Intervals: prolonged QRS  ST/T Wave abnormalities: nonspecific T wave changes  Conduction Disutrbances:left bundle branch block  Narrative Interpretation:   Old EKG Reviewed: unchanged No changes when compared to 06/17/12   MDM   Dr Luisa Hart to see in ED.   Dr Rhea Belton to see in ED.   Signed out to Dr Judd Lien.      Loren Racer, MD 09/01/12 [phone]

## 2012-09-01 NOTE — ED Notes (Addendum)
Pt began experiencing heavy chest pain around 1800 last night. Pt had no n/v/sob. Pt has history of bypass and stent placement. Per ems pt afib on monitor. Pt took 1 NTG prior to EMS arrival with no pain relief.  Pt states that she had this type of pain before which ended up being gall stones.

## 2012-09-01 NOTE — H&P (Signed)
[name] is an 61 y.o. female.   Chief Complaint: RUQ abdominal pain HPI: Pt with known history of GB disease was seen as outpatient for elective cholecystectomy but never got scheduled.  Cardiology saw her and cleared her.  Last night developed RUQ pain after eating.  Sharp in nature with N/V.  U/S shows gallstones and mild GB wall thickening. Slight increase in lipase to 112 and elevated LFT.  History of dilated CBD evaluated by Dr Elnoria Howard without cause found.     Past Medical History  Diagnosis Date  . Hypertension   . Myocardial infarction 2005  . Dysrhythmia     afib  . Depression   . Anxiety   . Pneumonia   . Hepatitis A   . Measles   . Chicken pox   . Hyperlipidemia   . Joint pain     both legs  . Herpes   . Insomnia   . CHF (congestive heart failure)   . COPD (chronic obstructive pulmonary disease)     Past Surgical History  Procedure Date  . Coronary artery bypass graft   . Diagnostic laparoscopy   . Eus 04/14/2012    Procedure: UPPER ENDOSCOPIC ULTRASOUND (EUS) LINEAR;  Surgeon: Theda Belfast, MD;  Location: [facility];  Service: Endoscopy;  Laterality: N/A;  . Esophagogastroduodenoscopy 04/14/2012    Procedure: ESOPHAGOGASTRODUODENOSCOPY (EGD);  Surgeon: Theda Belfast, MD;  Location: [facility];  Service: Endoscopy;  Laterality: N/A;    Family History  Problem Relation Age of Onset  . Heart disease Mother   . Stroke Mother   . Heart disease Sister   . Diabetes Sister   . Hypertension Sister   . Diabetes Maternal Aunt    Social History:  reports that she quit smoking about 18 years ago. She has never used smokeless tobacco. She reports that she does not drink alcohol or use illicit drugs.  Allergies:  Allergies  Allergen Reactions  . Tetracyclines & Related Rash     (Not in a hospital admission)  Results for orders placed during the hospital encounter of 09/01/12 (from the past 48 hour(s))  CBC WITH DIFFERENTIAL     Status: Abnormal   Collection Time   09/01/12  4:49 AM      Component Value Range Comment   WBC 10.1  4.0 - 10.5 K/uL    RBC 4.05  3.87 - 5.11 MIL/uL    Hemoglobin 12.8  12.0 - 15.0 g/dL    HCT 16.1  09.6 - 04.5 %    MCV 95.1  78.0 - 100.0 fL    MCH 31.6  26.0 - 34.0 pg    MCHC 33.2  30.0 - 36.0 g/dL    RDW 40.9  81.1 - 91.4 %    Platelets 231  150 - 400 K/uL    Neutrophils Relative 85 (*) 43 - 77 %    Neutro Abs 8.6 (*) 1.7 - 7.7 K/uL    Lymphocytes Relative 8 (*) 12 - 46 %    Lymphs Abs 0.8  0.7 - 4.0 K/uL    Monocytes Relative 6  3 - 12 %    Monocytes Absolute 0.6  0.1 - 1.0 K/uL    Eosinophils Relative 1  0 - 5 %    Eosinophils Absolute 0.1  0.0 - 0.7 K/uL    Basophils Relative 0  0 - 1 %    Basophils Absolute 0.0  0.0 - 0.1 K/uL   COMPREHENSIVE METABOLIC PANEL  Status: Abnormal   Collection Time   09/01/12  4:49 AM      Component Value Range Comment   Sodium 138  135 - 145 mEq/L    Potassium 4.3  3.5 - 5.1 mEq/L    Chloride 99  96 - 112 mEq/L    CO2 28  19 - 32 mEq/L    Glucose, Bld 138 (*) 70 - 99 mg/dL    BUN 18  6 - 23 mg/dL    Creatinine, Ser 9.60  0.50 - 1.10 mg/dL    Calcium 45.4  8.4 - 10.5 mg/dL    Total Protein 7.0  6.0 - 8.3 g/dL    Albumin 3.5  3.5 - 5.2 g/dL    AST 098 (*) 0 - 37 U/L    ALT 250 (*) 0 - 35 U/L    Alkaline Phosphatase 250 (*) 39 - 117 U/L    Total Bilirubin 2.7 (*) 0.3 - 1.2 mg/dL    GFR calc non Af Amer >90  >90 mL/min    GFR calc Af Amer >90  >90 mL/min   LIPASE, BLOOD     Status: Abnormal   Collection Time   09/01/12  4:49 AM      Component Value Range Comment   Lipase 112 (*) 11 - 59 U/L   POCT I-STAT TROPONIN I     Status: Normal   Collection Time   09/01/12  4:59 AM      Component Value Range Comment   Troponin i, poc 0.00  0.00 - 0.08 ng/mL    Comment 3             Dg Chest 2 View  09/01/2012  *RADIOLOGY REPORT*  Clinical Data: Heavy chest pain; atrial fibrillation.  CHEST - 2 VIEW  Comparison: Chest radiograph performed 06/17/2012   Findings: The lungs are hyperexpanded, with flattening of the hemidiaphragms, likely reflecting COPD.  Mild left basilar atelectasis or scarring is noted.  There is mild blunting of the left costophrenic angle, which may reflect scarring.  No definite pleural effusion or pneumothorax is seen.  The heart is borderline normal in size; the patient is status post median sternotomy, with evidence of prior CABG.  No acute osseous abnormalities are seen.  IMPRESSION: Mild left basilar atelectasis or scarring; findings suggest COPD. Lungs otherwise grossly clear.   Original Report Authenticated By: Tonia Ghent, M.D.    US Abdomen Complete  09/01/2012  *RADIOLOGY REPORT*  Clinical Data:  Chest pain.  ABDOMINAL ULTRASOUND COMPLETE  Comparison:  CT of the chest, abdomen and pelvis performed 11/27/2009, and abdominal ultrasound performed 02/11/2009  Findings:  Gallbladder:  There is a somewhat prominent ball of sludge noted at the fundus of the gallbladder, measuring approximately 2.1 x 1.7 x 0.9 cm.  This contains scattered foci of calcification.  The gallbladder wall appears mildly thickened, but no pericholecystic fluid is seen.  The previously suspected small gallbladder polyp at the fundus is not well characterized.  No ultrasonographic Murphy's sign is elicited.  Common Bile Duct:  1.1 cm in diameter; diffusely dilated, though apparently stable from 2010.  Liver:  Normal parenchymal echogenicity and echotexture; no focal lesions identified. Diffuse intrahepatic biliary ductal dilatation is again seen; this also appears stable from 2010.  Limited Doppler evaluation demonstrates normal blood flow within the liver.  IVC:  Unremarkable in appearance.  Pancreas:  Although the pancreas is difficult to visualize in its entirety due to overlying bowel gas, no acute pancreatic abnormality is  identified.  Mild distension of the pancreatic duct, measuring slightly over 0.3 cm, is stable from prior studies.  Spleen:  7.7 cm in  length; within normal limits in size and echotexture.  Right kidney:  9.3 cm in length; normal in size, configuration and parenchymal echogenicity.  No evidence of mass or hydronephrosis.  Left kidney:  7.1 cm in length; grossly normal in size.  The left kidney is not well characterized due to limitations in patient positioning.  Abdominal Aorta:  Normal in caliber; no aneurysm identified.  IMPRESSION:  1.  Prominent ball of sludge at the fundus of the gallbladder, measuring 2.1 cm, containing tiny stones.  The gallbladder wall appears mildly thickened, though there is no additional evidence to suggest cholecystitis. 2.  Diffuse dilatation of the common hepatic duct and intrahepatic biliary ducts, and mild distension of the pancreatic duct, appears stable from 2010.  No definite distal obstructing stone characterized.   Original Report Authenticated By: Tonia Ghent, M.D.     Review of Systems  Constitutional: Positive for diaphoresis. Negative for fever and chills.  HENT: Negative.   Respiratory: Negative.   Cardiovascular: Negative.   Gastrointestinal: Positive for nausea, vomiting and abdominal pain.  Genitourinary: Negative.   Musculoskeletal: Negative.   Skin: Negative.   Neurological: Negative.   Endo/Heme/Allergies: Negative.   Psychiatric/Behavioral: Negative.     Blood pressure 103/49, pulse 76, temperature 98.6 F (37 C), temperature source Oral, resp. rate 16, SpO2 89.00%. Physical Exam  Constitutional: She is oriented to person, place, and time. She appears well-developed and well-nourished.  HENT:  Head: Normocephalic and atraumatic.  Eyes: EOM are normal. Pupils are equal, round, and reactive to light.  Neck: Normal range of motion. Neck supple.  Cardiovascular: Normal rate and regular rhythm.   Respiratory: Effort normal and breath sounds normal.    GI: There is tenderness in the right upper quadrant and epigastric area. There is positive Murphy's sign.  Musculoskeletal:  Normal range of motion.  Neurological: She is alert and oriented to person, place, and time.  Skin: Skin is warm.  Psychiatric: She has a normal mood and affect. Her behavior is normal. Judgment and thought content normal.     Assessment/Plan Acute cholecystitis slight elevation of lipase.  May have mild pancreatitis but positive Murphy's sign on exam.  Rather proceed with cholecystectomy first with IOC. COPD CAD reviewed note from [facility] cardiology from sept 2013.  Intermediate to high risk.  On plavix but cannot wait for it to wear off.  Discussed with patient risk but recommend Lap chole /IOC.  May need GI to see but has been evaluated by Dr Elnoria Howard in the past. LFT elevated but think given exam Lap chole best first step with IOC. The procedure has been discussed with the patient. Operative and non operative treatments have been discussed. Risks of surgery include bleeding, infection,  Common bile duct injury,  Injury to the stomach,liver, colon,small intestine, abdominal wall,  Diaphragm,  Major blood vessels,  And the need for an open procedure.  Other risks include worsening of medical problems, death,  DVT and pulmonary embolism, and cardiovascular events.   Medical options have also been discussed. The patient has been informed of long term expectations of surgery and non surgical options,  The patient agrees to proceed.     Azaylea Maves A. 09/01/2012, 8:52 AM

## 2012-09-01 NOTE — Progress Notes (Signed)
Subjective: I was asked by the ER physician to see [name] for a GI evaluation. She has just had her surgery as per the OR nurse and is presently in the holding area. As noted by Dr. Luisa Hart, she has had a GI evaluation by Dr. Elnoria Howard in the recent past. I am unable to interview her now. I will be back on call on Sunday and will review her records if any further assistance is needed.   Objective: Vital signs in last 24 hours: Temp:  [98.1 F (36.7 C)-99.4 F (37.4 C)] 98.1 F (36.7 C) (11/29 1410) Pulse Rate:  [52-76] 52  (11/29 1410) Resp:  [15-26] 15  (11/29 1410) BP: (103-144)/(39-59) 105/56 mmHg (11/29 1410) SpO2:  [89 %-99 %] 97 % (11/29 1410)     Intake/Output this shift: Total I/O In: -  Out: 300 [Urine:300]  GPE Not done.  Lab Results:  [facility] 09/01/12 0449  WBC 10.1  HGB 12.8  HCT 38.5  PLT 231   BMET  Basename 09/01/12 0449  NA 138  K 4.3  CL 99  CO2 28  GLUCOSE 138*  BUN 18  CREATININE 0.69  CALCIUM 10.0   LFT  Basename 09/01/12 0449  PROT 7.0  ALBUMIN 3.5  AST 522*  ALT 250*  ALKPHOS 250*  BILITOT 2.7*  BILIDIR --  IBILI --   Studies/Results: Dg Chest 2 View  09/01/2012  *RADIOLOGY REPORT*  Clinical Data: Heavy chest pain; atrial fibrillation.  CHEST - 2 VIEW  Comparison: Chest radiograph performed 06/17/2012  Findings: The lungs are hyperexpanded, with flattening of the hemidiaphragms, likely reflecting COPD.  Mild left basilar atelectasis or scarring is noted.  There is mild blunting of the left costophrenic angle, which may reflect scarring.  No definite pleural effusion or pneumothorax is seen.  The heart is borderline normal in size; the patient is status post median sternotomy, with evidence of prior CABG.  No acute osseous abnormalities are seen.  IMPRESSION: Mild left basilar atelectasis or scarring; findings suggest COPD. Lungs otherwise grossly clear.   Original Report Authenticated By: Tonia Ghent, M.D.    US Abdomen  Complete  09/01/2012  *RADIOLOGY REPORT*  Clinical Data:  Chest pain.  ABDOMINAL ULTRASOUND COMPLETE  Comparison:  CT of the chest, abdomen and pelvis performed 11/27/2009, and abdominal ultrasound performed 02/11/2009  Findings:  Gallbladder:  There is a somewhat prominent ball of sludge noted at the fundus of the gallbladder, measuring approximately 2.1 x 1.7 x 0.9 cm.  This contains scattered foci of calcification.  The gallbladder wall appears mildly thickened, but no pericholecystic fluid is seen.  The previously suspected small gallbladder polyp at the fundus is not well characterized.  No ultrasonographic Murphy's sign is elicited.  Common Bile Duct:  1.1 cm in diameter; diffusely dilated, though apparently stable from 2010.  Liver:  Normal parenchymal echogenicity and echotexture; no focal lesions identified. Diffuse intrahepatic biliary ductal dilatation is again seen; this also appears stable from 2010.  Limited Doppler evaluation demonstrates normal blood flow within the liver.  IVC:  Unremarkable in appearance.  Pancreas:  Although the pancreas is difficult to visualize in its entirety due to overlying bowel gas, no acute pancreatic abnormality is identified.  Mild distension of the pancreatic duct, measuring slightly over 0.3 cm, is stable from prior studies.  Spleen:  7.7 cm in length; within normal limits in size and echotexture.  Right kidney:  9.3 cm in length; normal in size, configuration and parenchymal echogenicity.  No evidence  of mass or hydronephrosis.  Left kidney:  7.1 cm in length; grossly normal in size.  The left kidney is not well characterized due to limitations in patient positioning.  Abdominal Aorta:  Normal in caliber; no aneurysm identified.  IMPRESSION:  1.  Prominent ball of sludge at the fundus of the gallbladder, measuring 2.1 cm, containing tiny stones.  The gallbladder wall appears mildly thickened, though there is no additional evidence to suggest cholecystitis. 2.   Diffuse dilatation of the common hepatic duct and intrahepatic biliary ducts, and mild distension of the pancreatic duct, appears stable from 2010.  No definite distal obstructing stone characterized.   Original Report Authenticated By: Tonia Ghent, M.D.     Medications: I have reviewed the patient's current medications.  Assessment/Plan: ?Sludge ball vs cholelithiasis, ductal dilatation with RUQ pain and biliary colic: will follow as needed.  2) Mildly elevated lipase with abnormal LFT's: monitor closely.   LOS: 0 days   [name] 09/01/2012, 3:13 PM

## 2012-09-01 NOTE — ED Notes (Signed)
Pt returned from X-ray.  

## 2012-09-01 NOTE — Anesthesia Preprocedure Evaluation (Addendum)
Anesthesia Evaluation  Patient identified by MRN, date of birth, ID band Patient awake    Reviewed: Allergy & Precautions, H&P , NPO status , Patient's Chart, lab work & pertinent test results, reviewed documented beta blocker date and time   Airway Mallampati: I TM Distance: >3 FB Neck ROM: full    Dental  (+) Dental Advisory Given, Missing, Chipped and Poor Dentition   Pulmonary COPD         Cardiovascular hypertension, Pt. on medications and Pt. on home beta blockers + CAD, + Past MI, + CABG and +CHF + dysrhythmias Rhythm:regular Rate:Normal     Neuro/Psych PSYCHIATRIC DISORDERS Anxiety Depression    GI/Hepatic (+) Hepatitis -, A  Endo/Other    Renal/GU      Musculoskeletal   Abdominal   Peds  Hematology   Anesthesia Other Findings Will evaluate need for Beta Blocker administration intra-op  Reproductive/Obstetrics                         Anesthesia Physical Anesthesia Plan  ASA: IV  Anesthesia Plan: General   Post-op Pain Management:    Induction: Intravenous  Airway Management Planned: Oral ETT  Additional Equipment:   Intra-op Plan:   Post-operative Plan: Extubation in OR  Informed Consent: I have reviewed the patients History and Physical, chart, labs and discussed the procedure including the risks, benefits and alternatives for the proposed anesthesia with the patient or authorized representative who has indicated his/her understanding and acceptance.     Plan Discussed with: CRNA, Anesthesiologist and Surgeon  Anesthesia Plan Comments:         Anesthesia Quick Evaluation

## 2012-09-01 NOTE — Progress Notes (Signed)
UR completed 

## 2012-09-01 NOTE — Op Note (Signed)
Laparoscopic Cholecystectomy with IOC Procedure Note  Indications: This patient presents with symptomatic gallbladder disease and will undergo laparoscopic cholecystectomy.The procedure has been discussed with the patient. Operative and non operative treatments have been discussed. Risks of surgery include bleeding, infection,  Common bile duct injury,  Injury to the stomach,liver, colon,small intestine, abdominal wall,  Diaphragm,  Major blood vessels,  And the need for an open procedure.  Other risks include worsening of medical problems, death,  DVT and pulmonary embolism, and cardiovascular events.   Medical options have also been discussed. The patient has been informed of long term expectations of surgery and non surgical options,  The patient agrees to proceed.    Pre-operative Diagnosis: Acute and chronic cholecystitis  Post-operative Diagnosis: Same  Surgeon: Evin Chirco A.   Assistants: Dr Andrey Campanile  Anesthesia: General endotracheal anesthesia  ASA Class: 3  Procedure Details  The patient was seen again in the Holding Room. The risks, benefits, complications, treatment options, and expected outcomes were discussed with the patient. The possibilities of reaction to medication, pulmonary aspiration, perforation of viscus, bleeding, recurrent infection, finding a normal gallbladder, the need for additional procedures, failure to diagnose a condition, the possible need to convert to an open procedure, and creating a complication requiring transfusion or operation were discussed with the patient. The patient and/or family concurred with the proposed plan, giving informed consent. The site of surgery properly noted/marked. The patient was taken to Operating Room, identified as Caren Hazy and the procedure verified as Laparoscopic Cholecystectomy with Intraoperative Cholangiograms. A Time Out was held and the above information confirmed.  Prior to the induction of general anesthesia,  antibiotic prophylaxis was administered. General endotracheal anesthesia was then administered and tolerated well. After the induction, the abdomen was prepped in the usual sterile fashion. The patient was positioned in the supine position with the left arm comfortably tucked, along with some reverse Trendelenburg.  Local anesthetic agent was injected into the skin near the umbilicus and an incision made. The midline fascia was incised and the Hasson technique was used to introduce a 12 mm port under direct vision. It was secured with a figure of eight Vicryl suture placed in the usual fashion. Pneumoperitoneum was then created with CO2 and tolerated well without any adverse changes in the patient's vital signs. Additional trocars were introduced under direct vision with an 11 mm trocar in the epigastrium and two  5 mm trocars in the right upper quadrant.    The gallbladder was identified, the fundus grasped and retracted cephalad. Adhesions were lysed bluntly and with the electrocautery where indicated, taking care not to injure any adjacent organs or viscus. The infundibulum was grasped and retracted laterally, exposing the peritoneum overlying the triangle of Calot. This was then divided and exposed in a blunt fashion. The cystic duct was clearly identified and bluntly dissected circumferentially. The junctions of the gallbladder, cystic duct and common bile duct were clearly identified prior to the division of any linear structure.   An incision was made in the cystic duct and the cholangiogram catheter introduced. The catheter was secured using an endoclip. The study showed no stones and good visualization of the distal and proximal biliary tree. There was significant dilation of the CBD  To 1.5 cm  But no source of obstruction.   The catheter was then removed.   The cystic duct was then  ligated with surgical clips  on the patient side and  clipped on the gallbladder side and divided. The cystic  artery  was identified, dissected free, ligated with clips and divided as well. Posterior cystic artery clipped and divided.  The gallbladder was dissected from the liver bed in retrograde fashion with the electrocautery. The gallbladder was removed. The liver bed was irrigated and inspected. Hemostasis was achieved with the electrocautery. Copious irrigation was utilized and was repeatedly aspirated until clear all particulate matter. Hemostasis was achieved with no signs  Of bleeding or bile leakage. Surgecel placed in gallbladder bed.Gallbladder extracted at umbilical incision with endocatch bag.  Pneumoperitoneum was completely reduced after viewing removal of the trocars under direct vision. The wound was thoroughly irrigated and the fascia was then closed with a figure of eight suture; the skin was then closed with 4 O monocryl and a sterile dressing was applied of Dermabond.  Instrument, sponge, and needle counts were correct at closure and at the conclusion of the case.   Findings: Cholecystitis with Cholelithiasis  Estimated Blood Loss: less than 50 mL                 Total IV Fluids: 1500 mL         Specimens: Gallbladder           Complications: None; patient tolerated the procedure well.         Disposition: PACU - hemodynamically stable.         Condition: stable

## 2012-09-01 NOTE — Anesthesia Postprocedure Evaluation (Signed)
  Anesthesia Post-op Note  Patient: [name]  Procedure(s) Performed: Procedure(s) (LRB) with comments: LAPAROSCOPIC CHOLECYSTECTOMY WITH INTRAOPERATIVE CHOLANGIOGRAM (N/A)  Patient Location: PACU  Anesthesia Type:General  Level of Consciousness: awake, oriented and patient cooperative  Airway and Oxygen Therapy: Patient Spontanous Breathing  Post-op Pain: mild  Post-op Assessment: Post-op Vital signs reviewed, Patient's Cardiovascular Status Stable, Respiratory Function Stable, Patent Airway, No signs of Nausea or vomiting and Pain level controlled  Post-op Vital Signs: stable  Complications: No apparent anesthesia complications

## 2012-09-01 NOTE — Transfer of Care (Signed)
Immediate Anesthesia Transfer of Care Note  Patient: [name]  Procedure(s) Performed: Procedure(s) (LRB) with comments: LAPAROSCOPIC CHOLECYSTECTOMY WITH INTRAOPERATIVE CHOLANGIOGRAM (N/A)  Patient Location: PACU  Anesthesia Type:General  Level of Consciousness: sedated  Airway & Oxygen Therapy: Patient Spontanous Breathing and Patient connected to nasal cannula oxygen  Post-op Assessment: Report given to PACU RN and Post -op Vital signs reviewed and stable  Post vital signs: Reviewed and stable  Complications: No apparent anesthesia complications

## 2012-09-02 LAB — CBC
HCT: 39.8 % (ref 36.0–46.0)
Hemoglobin: 13.7 g/dL (ref 12.0–15.0)
MCH: 32 pg (ref 26.0–34.0)
MCHC: 34.4 g/dL (ref 30.0–36.0)
MCV: 93 fL (ref 78.0–100.0)
RDW: 14.5 % (ref 11.5–15.5)

## 2012-09-02 LAB — COMPREHENSIVE METABOLIC PANEL
ALT: 198 U/L — ABNORMAL HIGH (ref 0–35)
AST: 261 U/L — ABNORMAL HIGH (ref 0–37)
Albumin: 2.8 g/dL — ABNORMAL LOW (ref 3.5–5.2)
Alkaline Phosphatase: 250 U/L — ABNORMAL HIGH (ref 39–117)
Calcium: 9 mg/dL (ref 8.4–10.5)
GFR calc Af Amer: >90 mL/min (ref >90)
Potassium: 4.2 mEq/L (ref 3.5–5.1)
Sodium: 132 mEq/L — ABNORMAL LOW (ref 135–145)
Total Protein: 6.3 g/dL (ref 6.0–8.3)

## 2012-09-02 MED ORDER — METHADONE HCL 10 MG PO TABS
60.0000 mg | ORAL_TABLET | Freq: Every day | ORAL | Status: DC
  Administered 2012-09-02 – 2012-09-04 (×3): 60 mg via ORAL
  Filled 2012-09-02 (×3): qty 6

## 2012-09-02 MED ORDER — LORATADINE 10 MG PO TABS
10.0000 mg | ORAL_TABLET | Freq: Every day | ORAL | Status: DC
  Administered 2012-09-02 – 2012-09-04 (×3): 10 mg via ORAL
  Filled 2012-09-02 (×3): qty 1

## 2012-09-02 MED ORDER — ATORVASTATIN CALCIUM 40 MG PO TABS
40.0000 mg | ORAL_TABLET | Freq: Every day | ORAL | Status: DC
  Administered 2012-09-02 – 2012-09-03 (×2): 40 mg via ORAL
  Filled 2012-09-02 (×3): qty 1

## 2012-09-02 MED ORDER — METHADONE HCL 10 MG/ML PO CONC: 60.0000 mg | Freq: Every day | ORAL | Status: DC

## 2012-09-02 MED ORDER — CITALOPRAM HYDROBROMIDE 20 MG PO TABS
20.0000 mg | ORAL_TABLET | Freq: Every day | ORAL | Status: DC
  Administered 2012-09-02 – 2012-09-04 (×3): 20 mg via ORAL
  Filled 2012-09-02 (×3): qty 1

## 2012-09-02 NOTE — Progress Notes (Signed)
Pain. No n/v.  Alert, nad sitting in chair  cta with decreased bs Soft, expected TTP, incision c/d/i  Aggressive pulm toilet ?stone on IOC. Will follow LFTs  Mary Sella. Andrey Campanile, MD, FACS General, Bariatric, & Minimally Invasive Surgery [facility], [place]

## 2012-09-02 NOTE — Progress Notes (Signed)
Patient and her daughter are requesting that her home meds be restarted asap. Patient is very anxious.Left message for [facility] MD on call to return my call. Did not hear back from MD. [name] 09/02/2012

## 2012-09-02 NOTE — Progress Notes (Signed)
1 Day Post-Op  Subjective: Pt is c/o of a lot of pain and requesting her methadone.  She has needed Dilaudid every 2 hours and percocet in between.  Pt is seen at [facility] for methadone.  Pt denies any nausea/vomiting, but is not very hungry.  Pt is urinating regularly, no flatus or BM yet.  Pt has not been OOB due to pain.    Objective: Vital signs in last 24 hours: Temp:  [97.2 F (36.2 C)-100.5 F (38.1 C)] 100.5 F (38.1 C) (11/30 0616) Pulse Rate:  [52-94] 94  (11/30 0616) Resp:  [15-32] 28  (11/30 0616) BP: (105-158)/(39-72) 136/66 mmHg (11/30 0616) SpO2:  [88 %-99 %] 91 % (11/30 0616) Weight:  [112 lb 7 oz (51 kg)] 112 lb 7 oz (51 kg) (11/30 0046) Last BM Date: 08/31/12  Intake/Output from previous day: 11/29 0701 - 11/30 0700 In: 2051.3 [I.V.:2051.3] Out: 715 [Urine:700; Blood:15] Intake/Output this shift:    PE: Gen:  Alert, NAD, pleasant Abd: Soft, tender and mildly distended, +BS, no HSM, incisions C/D/I   Lab Results:   Basename 09/02/12 0630 09/01/12 0449  WBC 5.9 10.1  HGB 13.7 12.8  HCT 39.8 38.5  PLT 187 231   BMET  Basename 09/02/12 0630 09/01/12 1748  NA 132* 132*  K 4.2 4.1  CL 96 98  CO2 24 23  GLUCOSE 134* 146*  BUN 11 14  CREATININE 0.54 0.55  CALCIUM 9.0 9.2   PT/INR No results found for this basename: LABPROT:2,INR:2 in the last 72 hours CMP     Component Value Date/Time   NA 132* 09/02/2012 0630   K 4.2 09/02/2012 0630   CL 96 09/02/2012 0630   CO2 24 09/02/2012 0630   GLUCOSE 134* 09/02/2012 0630   BUN 11 09/02/2012 0630   CREATININE 0.54 09/02/2012 0630   CALCIUM 9.0 09/02/2012 0630   PROT 6.3 09/02/2012 0630   ALBUMIN 2.8* 09/02/2012 0630   AST 261* 09/02/2012 0630   ALT 198* 09/02/2012 0630   ALKPHOS 250* 09/02/2012 0630   BILITOT 4.5* 09/02/2012 0630   GFRNONAA >90 09/02/2012 0630   GFRAA >90 09/02/2012 0630   Lipase     Component Value Date/Time   LIPASE 112* 09/01/2012 0449        Studies/Results: Dg Chest 2 View  09/01/2012  *RADIOLOGY REPORT*  Clinical Data: Heavy chest pain; atrial fibrillation.  CHEST - 2 VIEW  Comparison: Chest radiograph performed 06/17/2012  Findings: The lungs are hyperexpanded, with flattening of the hemidiaphragms, likely reflecting COPD.  Mild left basilar atelectasis or scarring is noted.  There is mild blunting of the left costophrenic angle, which may reflect scarring.  No definite pleural effusion or pneumothorax is seen.  The heart is borderline normal in size; the patient is status post median sternotomy, with evidence of prior CABG.  No acute osseous abnormalities are seen.  IMPRESSION: Mild left basilar atelectasis or scarring; findings suggest COPD. Lungs otherwise grossly clear.   Original Report Authenticated By: Tonia Ghent, M.D.    Dg Cholangiogram Operative  09/01/2012  *RADIOLOGY REPORT*  Clinical data:  Cholelithiasis.  Laparoscopic cholecystectomy.  INTRAOPERATIVE CHOLANGIOGRAM  09/01/2012:  Comparison: Abdominal ultrasound earlier same date.  MRCP 03/07/2009.  Findings:  2 series of cholangiographic images from the C-arm fluoroscopic device were submitted for interpretation post- operatively.  The cannula is present in the cystic duct remnant with excellent opacification of the common bile duct, common hepatic duct, and proximal intrahepatic ducts.  Moderate to  marked extrahepatic and central intrahepatic biliary ductal dilation. Very small filling defect in the distal common bile duct on the second series, consistent with a small stone.  Antegrade flow into the duodenum.  Opacification of a normal appearing duct in the head of the pancreas.  The radiologic technologist documented 24 seconds of fluoroscopy time.  Please correlate with findings at real time fluoroscopy.  IMPRESSION: Very small distal common bile duct stone without obstruction, as there is antegrade flow of contrast into the duodenum.  Moderate to marked  extrahepatic and central intrahepatic biliary ductal dilation which is chronic.  These images were submitted for radiologic interpretation only. Please see the procedural report for the amount of contrast and the fluoroscopy time utilized.   Original Report Authenticated By: Hulan Saas, M.D.    US Abdomen Complete  09/01/2012  *RADIOLOGY REPORT*  Clinical Data:  Chest pain.  ABDOMINAL ULTRASOUND COMPLETE  Comparison:  CT of the chest, abdomen and pelvis performed 11/27/2009, and abdominal ultrasound performed 02/11/2009  Findings:  Gallbladder:  There is a somewhat prominent ball of sludge noted at the fundus of the gallbladder, measuring approximately 2.1 x 1.7 x 0.9 cm.  This contains scattered foci of calcification.  The gallbladder wall appears mildly thickened, but no pericholecystic fluid is seen.  The previously suspected small gallbladder polyp at the fundus is not well characterized.  No ultrasonographic Murphy's sign is elicited.  Common Bile Duct:  1.1 cm in diameter; diffusely dilated, though apparently stable from 2010.  Liver:  Normal parenchymal echogenicity and echotexture; no focal lesions identified. Diffuse intrahepatic biliary ductal dilatation is again seen; this also appears stable from 2010.  Limited Doppler evaluation demonstrates normal blood flow within the liver.  IVC:  Unremarkable in appearance.  Pancreas:  Although the pancreas is difficult to visualize in its entirety due to overlying bowel gas, no acute pancreatic abnormality is identified.  Mild distension of the pancreatic duct, measuring slightly over 0.3 cm, is stable from prior studies.  Spleen:  7.7 cm in length; within normal limits in size and echotexture.  Right kidney:  9.3 cm in length; normal in size, configuration and parenchymal echogenicity.  No evidence of mass or hydronephrosis.  Left kidney:  7.1 cm in length; grossly normal in size.  The left kidney is not well characterized due to limitations in patient  positioning.  Abdominal Aorta:  Normal in caliber; no aneurysm identified.  IMPRESSION:  1.  Prominent ball of sludge at the fundus of the gallbladder, measuring 2.1 cm, containing tiny stones.  The gallbladder wall appears mildly thickened, though there is no additional evidence to suggest cholecystitis. 2.  Diffuse dilatation of the common hepatic duct and intrahepatic biliary ducts, and mild distension of the pancreatic duct, appears stable from 2010.  No definite distal obstructing stone characterized.   Original Report Authenticated By: Tonia Ghent, M.D.     Anti-infectives: Anti-infectives     Start     Dose/Rate Route Frequency Ordered Stop   09/02/12 0600   cefOXitin (MEFOXIN) 2 g in dextrose 5 % 50 mL IVPB  Status:  Discontinued        2 g 100 mL/hr over 30 Minutes Intravenous On call to O.R. 09/01/12 1740 09/01/12 1836   09/01/12 2130   cefOXitin (MEFOXIN) 2 g in dextrose 5 % 50 mL IVPB        2 g 100 mL/hr over 30 Minutes Intravenous Every 6 hours 09/01/12 1740  Assessment/Plan 61 y/o female POD 1 S/P lap chole and IOC 1.  IVF, antibiotics, pain control, started her on her methadone dose after calling to verify dosage at [facility]).  She has approximately 11/13 take home doses per RN at [facility] but was sent home. 2.  Ambulation OOB and IS 3.  Progress diet, appetite low, but on clears today will progress tomorrow 4.  Pt will likely need 1-2 days before she goes home 5.  Small CBD stone seen on IOC-will discuss with surgeon on possible f/u    LOS: 1 day    DORT, Sanyia Dini 09/02/2012, 8:03 AM Pager: [phone]

## 2012-09-02 NOTE — Progress Notes (Signed)
Nutrition Brief Note  Patient identified on the Malnutrition Screening Tool (MST) Report with a score of 2.   Body mass index is 20.56 kg/(m^2). Pt meets criteria for normal weight based on current BMI.   Current diet order is Clear Liquids, patient is consuming approximately 75-100% of meals at this time. Labs and medications reviewed.   Appetite is fair secondary to cholecystitis s/p cholecystectomy, but patient denies recent weight loss.   No nutrition interventions warranted at this time. If nutrition issues arise, please consult RD.   Linnell Fulling, RD, LDN Pager #: [phone] After-Hours Pager #: [phone]

## 2012-09-03 LAB — COMPREHENSIVE METABOLIC PANEL
ALT: 101 U/L — ABNORMAL HIGH (ref 0–35)
Alkaline Phosphatase: 148 U/L — ABNORMAL HIGH (ref 39–117)
BUN: 17 mg/dL (ref 6–23)
Chloride: 104 mEq/L (ref 96–112)
GFR calc Af Amer: >90 mL/min (ref >90)
Glucose, Bld: 104 mg/dL — ABNORMAL HIGH (ref 70–99)
Potassium: 4.6 mEq/L (ref 3.5–5.1)
Sodium: 136 mEq/L (ref 135–145)
Total Bilirubin: 1.8 mg/dL — ABNORMAL HIGH (ref 0.3–1.2)
Total Protein: 5.4 g/dL — ABNORMAL LOW (ref 6.0–8.3)

## 2012-09-03 NOTE — Progress Notes (Signed)
Pt in bathroom Exam deferred D/w PA lfts improving - cont to follow  Home in AM  [facility]. Andrey Campanile, MD, FACS General, Bariatric, & Minimally Invasive Surgery [facility], [place]

## 2012-09-03 NOTE — Progress Notes (Signed)
2 Days Post-Op  Subjective: Pt doing well today, pain much improved after receiving methadone.  Pt denies any N/V and tolerating diet.  Pt ambulating through halls and to the BR.  Pt passing flatus and small BM yesterday.  Pt urinating improved.  Objective: Vital signs in last 24 hours: Temp:  [97.6 F (36.4 C)-99.7 F (37.6 C)] 98.6 F (37 C) (12/01 0545) Pulse Rate:  [76-84] 76  (12/01 0545) Resp:  [16-28] 20  (12/01 0545) BP: (94-113)/(43-54) 110/47 mmHg (12/01 0545) SpO2:  [92 %-96 %] 94 % (12/01 0545) Last BM Date: 08/31/12  Intake/Output from previous day: 11/30 0701 - 12/01 0700 In: 60 [P.O.:60] Out: 200 [Urine:200] Intake/Output this shift:    PE: Gen:  Alert, NAD, pleasant Abd: Soft, appropriately tender, +BS, no HSM, incisions C/D/I  Lab Results:   Basename 09/02/12 0630 09/01/12 0449  WBC 5.9 10.1  HGB 13.7 12.8  HCT 39.8 38.5  PLT 187 231   BMET  Basename 09/02/12 0630 09/01/12 1748  NA 132* 132*  K 4.2 4.1  CL 96 98  CO2 24 23  GLUCOSE 134* 146*  BUN 11 14  CREATININE 0.54 0.55  CALCIUM 9.0 9.2   PT/INR No results found for this basename: LABPROT:2,INR:2 in the last 72 hours CMP     Component Value Date/Time   NA 132* 09/02/2012 0630   K 4.2 09/02/2012 0630   CL 96 09/02/2012 0630   CO2 24 09/02/2012 0630   GLUCOSE 134* 09/02/2012 0630   BUN 11 09/02/2012 0630   CREATININE 0.54 09/02/2012 0630   CALCIUM 9.0 09/02/2012 0630   PROT 6.3 09/02/2012 0630   ALBUMIN 2.8* 09/02/2012 0630   AST 261* 09/02/2012 0630   ALT 198* 09/02/2012 0630   ALKPHOS 250* 09/02/2012 0630   BILITOT 4.5* 09/02/2012 0630   GFRNONAA >90 09/02/2012 0630   GFRAA >90 09/02/2012 0630   Lipase     Component Value Date/Time   LIPASE 112* 09/01/2012 0449       Studies/Results: Dg Cholangiogram Operative  09/01/2012  *RADIOLOGY REPORT*  Clinical data:  Cholelithiasis.  Laparoscopic cholecystectomy.  INTRAOPERATIVE CHOLANGIOGRAM  09/01/2012:  Comparison:  Abdominal ultrasound earlier same date.  MRCP 03/07/2009.  Findings:  2 series of cholangiographic images from the C-arm fluoroscopic device were submitted for interpretation post- operatively.  The cannula is present in the cystic duct remnant with excellent opacification of the common bile duct, common hepatic duct, and proximal intrahepatic ducts.  Moderate to marked extrahepatic and central intrahepatic biliary ductal dilation. Very small filling defect in the distal common bile duct on the second series, consistent with a small stone.  Antegrade flow into the duodenum.  Opacification of a normal appearing duct in the head of the pancreas.  The radiologic technologist documented 24 seconds of fluoroscopy time.  Please correlate with findings at real time fluoroscopy.  IMPRESSION: Very small distal common bile duct stone without obstruction, as there is antegrade flow of contrast into the duodenum.  Moderate to marked extrahepatic and central intrahepatic biliary ductal dilation which is chronic.  These images were submitted for radiologic interpretation only. Please see the procedural report for the amount of contrast and the fluoroscopy time utilized.   Original Report Authenticated By: Hulan Saas, M.D.     Anti-infectives: Anti-infectives     Start     Dose/Rate Route Frequency Ordered Stop   09/02/12 0600   cefOXitin (MEFOXIN) 2 g in dextrose 5 % 50 mL IVPB  Status:  Discontinued  2 g 100 mL/hr over 30 Minutes Intravenous On call to O.R. 09/01/12 1740 09/01/12 1836   09/01/12 2130   cefOXitin (MEFOXIN) 2 g in dextrose 5 % 50 mL IVPB  Status:  Discontinued        2 g 100 mL/hr over 30 Minutes Intravenous Every 6 hours 09/01/12 1740 09/02/12 0949           Assessment/Plan S/p lap chole with IOC, elevated bilirubin at 4.5 today from 1.7 down from 3.7 yesterday, low UOP  1.  Progress diet, cont pain control and antibiotics 2.  Ambulation OOB and IS 3.  Will keep fluid rate until  tolerating full liquids given low UOP recently 4.  Plan for d/c home tomorrow if improved   LOS: 2 days    DORT, Aundra Millet 09/03/2012, 7:53 AM Pager: [phone]

## 2012-09-04 ENCOUNTER — Encounter (HOSPITAL_COMMUNITY): Payer: Self-pay | Admitting: Surgery

## 2012-09-04 LAB — COMPREHENSIVE METABOLIC PANEL
AST: 81 U/L — ABNORMAL HIGH (ref 0–37)
CO2: 25 mEq/L (ref 19–32)
Calcium: 8.8 mg/dL (ref 8.4–10.5)
Creatinine, Ser: 0.66 mg/dL (ref 0.50–1.10)
GFR calc Af Amer: >90 mL/min (ref >90)
GFR calc non Af Amer: >90 mL/min (ref >90)
Glucose, Bld: 90 mg/dL (ref 70–99)
Total Protein: 5.7 g/dL — ABNORMAL LOW (ref 6.0–8.3)

## 2012-09-04 MED ORDER — OXYCODONE-ACETAMINOPHEN 5-325 MG PO TABS: 1.0000 | ORAL_TABLET | ORAL | Status: DC | PRN

## 2012-09-04 MED ORDER — PANTOPRAZOLE SODIUM 40 MG PO TBEC
40.0000 mg | DELAYED_RELEASE_TABLET | Freq: Every day | ORAL | Status: DC
  Administered 2012-09-04: 40 mg via ORAL
  Filled 2012-09-04: qty 1

## 2012-09-04 NOTE — Progress Notes (Signed)
PHARMACIST - PHYSICIAN COMMUNICATION  CONCERNING: Protonix IV to Oral Route Change Policy  RECOMMENDATION: This patient is receiving Protonix by the intravenous route.  Based on criteria approved by the Pharmacy and Therapeutics Committee, this is being converted to the equivalent oral dose form(s).   DESCRIPTION: These criteria include:  The patient is not neutropenic and does not exhibit a GI malabsorption state. Did not have GIB this admit.  The patient is eating (either orally or via tube) and/or has been taking other orally administered medications for a least 24 hours  If you have questions about this conversion, please contact the Pharmacy Department  Keliah Harned K. Allena Katz, PharmD, BCPS.  Clinical Pharmacist Pager [phone]. 09/04/2012 11:51 AM

## 2012-09-04 NOTE — Progress Notes (Signed)
Discharge home.

## 2012-09-04 NOTE — Discharge Summary (Signed)
Patient ID: [name] MRN: [id number] DOB/AGE: 1951-08-31 61 y.o.  Admit date: 09/01/2012 Discharge date: 09/04/2012  Procedures: lap chole with IOD  Consults: None  Reason for Admission: Pt with known history of GB disease was seen as outpatient for elective cholecystectomy but never got scheduled. Cardiology saw her and cleared her. Last night developed RUQ pain after eating. Sharp in nature with N/V. U/S shows gallstones and mild GB wall thickening. Slight increase in lipase to 112 and elevated LFT. History of dilated CBD evaluated by Dr Elnoria Howard without cause found.   Admission Diagnoses:  1. Acute cholecystitis Patient Active Problem List  Diagnosis  . Ischemic cardiomyopathy  . Anxiety  . CAD (coronary artery disease)  . Biliary colic  . COPD (chronic obstructive pulmonary disease)    Hospital Course: the patient was admitted and taken to the operating room where she underwent a lap chole with IOC.  This showed a distal CBD stone, but was non-obstructing.  Her LFTs were followed and were continuing to trend down each day.  Her diet was advanced as tolerated.  She was here for several days after surgery due to pain control issues.  These were better controlled once replaced on her methadone and percocet added.  She was stable for dc home on POD# 3.  PE:Abd: soft appropriately tender, +BS, ND, incisions c/d/i  Discharge Diagnoses:  1. Acute cholecystitis, distal non-obstructing CBD stone (no intervention required as LFTs trending down) Patient Active Problem List  Diagnosis  . Ischemic cardiomyopathy  . Anxiety  . CAD (coronary artery disease)  . Biliary colic  . COPD (chronic obstructive pulmonary disease)      Discharge Medications:   Medication List     As of 09/04/2012 11:48 AM    TAKE these medications         albuterol 108 (90 BASE) MCG/ACT inhaler   Commonly known as: PROVENTIL HFA;VENTOLIN HFA   Inhale 2 puffs into the lungs every 6 (six) hours as  needed for wheezing.      amoxicillin 500 MG capsule   Commonly known as: AMOXIL   Take 500 mg by mouth 3 (three) times daily.      aspirin 81 MG tablet   Take 81 mg by mouth daily.      atorvastatin 40 MG tablet   Commonly known as: LIPITOR   Take 40 mg by mouth daily.      carvedilol 12.5 MG tablet   Commonly known as: COREG   Take 12.5 mg by mouth 2 (two) times daily with a meal.      cetirizine 10 MG tablet   Commonly known as: ZYRTEC   Take 1 tablet (10 mg total) by mouth daily.      citalopram 20 MG tablet   Commonly known as: CELEXA   Take 20 mg by mouth daily.      clopidogrel 75 MG tablet   Commonly known as: PLAVIX   Take 75 mg by mouth daily.      digoxin 0.05 MG/ML solution   Commonly known as: LANOXIN   Take 0.125 mg by mouth daily.      LORazepam 0.5 MG tablet   Commonly known as: ATIVAN   Take 0.5 mg by mouth every 6 (six) hours as needed. For anxiety      METHADONE HCL PO   Take 60 mg by mouth.      nitroGLYCERIN 0.3 MG SL tablet   Commonly known as: NITROSTAT   Place 0.3  mg under the tongue every 5 (five) minutes as needed.      omeprazole 20 MG tablet   Commonly known as: PRILOSEC OTC   Take 20 mg by mouth daily.      oxyCODONE-acetaminophen 5-325 MG per tablet   Commonly known as: PERCOCET/ROXICET   Take 1-2 tablets by mouth every 4 (four) hours as needed.        Discharge Instructions:     Follow-up Information    Follow up with [facility]. On 09/19/2012. (2:45pm, arrive at 2:15pm)    Contact information:   [address]   [place] [address] [phone]          Signed: Letha Cape 09/04/2012, 11:48 AM

## 2012-09-04 NOTE — Discharge Summary (Signed)
Agree with above 

## 2012-09-05 MED FILL — Bupivacaine HCl Preservative Free (PF) Inj 0.25%: INTRAMUSCULAR | Qty: 30 | Status: AC

## 2012-09-19 ENCOUNTER — Encounter (INDEPENDENT_AMBULATORY_CARE_PROVIDER_SITE_OTHER): Payer: Self-pay

## 2012-09-19 ENCOUNTER — Ambulatory Visit (INDEPENDENT_AMBULATORY_CARE_PROVIDER_SITE_OTHER): Payer: Medicare Other | Admitting: General Surgery

## 2012-09-19 VITALS — BP 116/60 | HR 69 | Temp 98.2°F | Ht 62.0 in | Wt 103.4 lb

## 2012-09-19 DIAGNOSIS — Z9889 Other specified postprocedural states: Secondary | ICD-10-CM

## 2012-09-19 DIAGNOSIS — Z9049 Acquired absence of other specified parts of digestive tract: Secondary | ICD-10-CM

## 2012-09-19 DIAGNOSIS — K812 Acute cholecystitis with chronic cholecystitis: Secondary | ICD-10-CM

## 2012-09-19 NOTE — Progress Notes (Signed)
  Subjective: [name] is a 61 y.o. female who had a laparoscopic cholecystectomy  on 09/01/12 by Dr. Luisa Hart  returns to the clinic today.  Pathology reveals Acute and Chronic Cholecystitis, no tumor seen.  The patient is tolerating their diet well and is having no severe pain.  Bowel function is good.  The pre-operative symptoms of abdominal pain, nausea, and vomiting have resolved.  No problems with the wounds.  Pt is returning to normal activity.   Objective: PE: General:  Alert, NAD, pleasant Abdomen:  soft, NT/ND, +bs, incisions appear well-healed with no sign of infection or bleeding   Assessment/Plan  1.  S/P Laparoscopic Cholecystectomy: doing well, may resume regular activity without restrictions, Pt will follow up with [place] PRN and knows to call with questions or concerns.   She may resume a regular diet and full activity.  She may follow-up on a PRN basis.   Aris [place], PA-C 09/19/2012

## 2012-09-19 NOTE — Patient Instructions (Signed)
Patient is able to resume all normal activity as tolerated.  Follow up in the clinic as needed.

## 2013-02-22 ENCOUNTER — Other Ambulatory Visit: Payer: Self-pay | Admitting: *Deleted

## 2013-02-22 MED ORDER — CARVEDILOL 12.5 MG PO TABS: 12.5000 mg | ORAL_TABLET | Freq: Two times a day (BID) | ORAL | Status: DC

## 2013-03-26 ENCOUNTER — Other Ambulatory Visit: Payer: Self-pay

## 2013-03-26 MED ORDER — CLOPIDOGREL BISULFATE 75 MG PO TABS: 75.0000 mg | ORAL_TABLET | Freq: Every day | ORAL | Status: DC

## 2013-03-26 NOTE — Telephone Encounter (Signed)
Rx was sent to pharmacy electronically. 

## 2013-03-28 ENCOUNTER — Other Ambulatory Visit: Payer: Self-pay | Admitting: *Deleted

## 2013-03-28 MED ORDER — ATORVASTATIN CALCIUM 40 MG PO TABS: 40.0000 mg | ORAL_TABLET | Freq: Every day | ORAL | Status: DC

## 2013-03-29 ENCOUNTER — Other Ambulatory Visit: Payer: Self-pay

## 2013-03-29 MED ORDER — DIGOXIN 125 MCG PO TABS: 0.1250 mg | ORAL_TABLET | Freq: Every day | ORAL | Status: DC

## 2013-03-29 NOTE — Telephone Encounter (Signed)
Rx was sent to pharmacy electronically. 

## 2013-04-11 ENCOUNTER — Ambulatory Visit (INDEPENDENT_AMBULATORY_CARE_PROVIDER_SITE_OTHER): Payer: Medicare Other | Admitting: Family Medicine

## 2013-04-11 VITALS — BP 118/60 | HR 68 | Temp 98.4°F | Resp 16 | Ht 60.0 in | Wt 113.0 lb

## 2013-04-11 DIAGNOSIS — R0602 Shortness of breath: Secondary | ICD-10-CM

## 2013-04-11 DIAGNOSIS — J449 Chronic obstructive pulmonary disease, unspecified: Secondary | ICD-10-CM

## 2013-04-11 DIAGNOSIS — Z1231 Encounter for screening mammogram for malignant neoplasm of breast: Secondary | ICD-10-CM

## 2013-04-11 DIAGNOSIS — Z23 Encounter for immunization: Secondary | ICD-10-CM

## 2013-04-11 DIAGNOSIS — R0989 Other specified symptoms and signs involving the circulatory and respiratory systems: Secondary | ICD-10-CM

## 2013-04-11 DIAGNOSIS — R062 Wheezing: Secondary | ICD-10-CM

## 2013-04-11 DIAGNOSIS — I2510 Atherosclerotic heart disease of native coronary artery without angina pectoris: Secondary | ICD-10-CM

## 2013-04-11 DIAGNOSIS — Z91041 Radiographic dye allergy status: Secondary | ICD-10-CM

## 2013-04-11 DIAGNOSIS — Z0000 Encounter for general adult medical examination without abnormal findings: Secondary | ICD-10-CM

## 2013-04-11 DIAGNOSIS — Z79899 Other long term (current) drug therapy: Secondary | ICD-10-CM

## 2013-04-11 LAB — POCT CBC
Granulocyte percent: 57.4 %G (ref 37–80)
HCT, POC: 42.8 % (ref 37.7–47.9)
Hemoglobin: 13.5 g/dL (ref 12.2–16.2)
MCH, POC: 30.3 pg (ref 27–31.2)
MCV: 95.9 fL (ref 80–97)
RBC: 4.46 M/uL (ref 4.04–5.48)
WBC: 6.8 10*3/uL (ref 4.6–10.2)

## 2013-04-11 LAB — POCT URINALYSIS DIPSTICK
Blood, UA: NEGATIVE
Glucose, UA: NEGATIVE
Ketones, UA: NEGATIVE
Leukocytes, UA: NEGATIVE
Nitrite, UA: NEGATIVE
pH, UA: 5

## 2013-04-11 MED ORDER — CETIRIZINE HCL 10 MG PO TABS: 10.0000 mg | ORAL_TABLET | Freq: Every day | ORAL | Status: DC

## 2013-04-11 NOTE — Patient Instructions (Addendum)
Next time you are at the pharmacy - ask them if your insurance will pay for you to get a shingles vaccine and get it if it is covered.  Keeping You Healthy  Get These Tests  Blood Pressure- Have your blood pressure checked by your healthcare provider at least once a year.  Normal blood pressure is 120/80.  Weight- Have your body mass index (BMI) calculated to screen for obesity.  BMI is a measure of body fat based on height and weight.  You can calculate your own BMI at https://www.west-esparza.com/  Cholesterol- Have your cholesterol checked every year.  Diabetes- Have your blood sugar checked every year if you have high blood pressure, high cholesterol, a family history of diabetes or if you are overweight.  Pap Smear- Have a pap smear every 1 to 3 years if you have been sexually active.  If you are older than 65 and recent pap smears have been normal you may not need additional pap smears.  In addition, if you have had a hysterectomy  For benign disease additional pap smears are not necessary.  Mammogram-Yearly mammograms are essential for early detection of breast cancer  Screening for Colon Cancer- Colonoscopy starting at age 32. Screening may begin sooner depending on your family history and other health conditions.  Follow up colonoscopy as directed by your Gastroenterologist.  Screening for Osteoporosis- Screening begins at age 65 with bone density scanning, sooner if you are at higher risk for developing Osteoporosis.  Get these medicines  Calcium with Vitamin D- Your body requires 1200-1500 mg of Calcium a day and [phone] IU of Vitamin D a day.  You can only absorb 500 mg of Calcium at a time therefore Calcium must be taken in 2 or 3 separate doses throughout the day.  Hormones- Hormone therapy has been associated with increased risk for certain cancers and heart disease.  Talk to your healthcare provider about if you need relief from menopausal symptoms.  Aspirin- Ask your  healthcare provider about taking Aspirin to prevent Heart Disease and Stroke.  Get these Immuniztions  Flu shot- Every fall  Pneumonia shot- Once after the age of 61; if you are younger ask your healthcare provider if you need a pneumonia shot.  Tetanus- Every ten years.  Zostavax- Once after the age of 21 to prevent shingles.  Take these steps  Don't smoke- Your healthcare provider can help you quit. For tips on how to quit, ask your healthcare provider or go to www.smokefree.gov or call 1-800 QUIT-NOW.  Be physically active- Exercise 5 days a week for a minimum of 30 minutes.  If you are not already physically active, start slow and gradually work up to 30 minutes of moderate physical activity.  Try walking, dancing, bike riding, swimming, etc.  Eat a healthy diet- Eat a variety of healthy foods such as fruits, vegetables, whole grains, low fat milk, low fat cheeses, yogurt, lean meats, chicken, fish, eggs, dried beans, tofu, etc.  For more information go to www.thenutritionsource.org  Dental visit- Brush and floss teeth twice daily; visit your dentist twice a year.  Eye exam- Visit your Optometrist or Ophthalmologist yearly.  Drink alcohol in moderation- Limit alcohol intake to one drink or less a day.  Never drink and drive.  Depression- Your emotional health is as important as your physical health.  If you're feeling down or losing interest in things you normally enjoy, please talk to your healthcare provider.  Seat Belts- can save your life; always wear  one  Smoke/Carbon Building services engineer- These detectors need to be installed on the appropriate level of your home.  Replace batteries at least once a year.  Violence- If anyone is threatening or hurting you, please tell your healthcare provider.  Living Will/ Health care power of attorney- Discuss with your healthcare provider and family.

## 2013-04-11 NOTE — Progress Notes (Signed)
Subjective:    Patient ID: [name], female    DOB: Jun 19, 1951, 62 y.o.   MRN: [id number] Chief Complaint  Patient presents with  . pe- not complete.    pt states her urine an dbloodwork has already been done. was told to see a dr within 14 days.     HPI Had a light breakfast at 6 a.m.  Has not had anything at all since then other than water.  Had bloodwork and urine done at [facility] recently but does not have any of this info or results. Would like to repeat labs.  Was a very heavy smoker for a long time, stopped in 2005.  Also diagnosed COPD. Her ICM is followed by Dr. Herbie Baltimore at [facility] - sees him every 6 months - stable since 2005.  Reports she has PFTs done fairly regularly by Dr. Herbie Baltimore - her cardiology.  Not sexually active.  Sees Dr. Huel Cote for gynecology and paps - has not had any abnormality and last was normal.  Has not had a mammogram in a while.  Past Medical History  Diagnosis Date  . Hypertension   . Myocardial infarction 2005  . Dysrhythmia     afib  . Depression   . Anxiety   . Pneumonia   . Hepatitis A   . Measles   . Chicken pox   . Hyperlipidemia   . Joint pain     both legs  . Herpes   . Insomnia   . CHF (congestive heart failure)   . COPD (chronic obstructive pulmonary disease)    Past Surgical History  Procedure Laterality Date  . Coronary artery bypass graft    . Diagnostic laparoscopy    . Eus  04/14/2012    Procedure: UPPER ENDOSCOPIC ULTRASOUND (EUS) LINEAR;  Surgeon: Theda Belfast, MD;  Location: [facility];  Service: Endoscopy;  Laterality: N/A;  . Esophagogastroduodenoscopy  04/14/2012    Procedure: ESOPHAGOGASTRODUODENOSCOPY (EGD);  Surgeon: Theda Belfast, MD;  Location: [facility];  Service: Endoscopy;  Laterality: N/A;  . Cholecystectomy  09/01/2012    Procedure: LAPAROSCOPIC CHOLECYSTECTOMY WITH INTRAOPERATIVE CHOLANGIOGRAM;  Surgeon: Clovis Pu. Cornett, MD;  Location: [facility] OR;  Service: General;  Laterality: N/A;    Current Outpatient Prescriptions on File Prior to Visit  Medication Sig Dispense Refill  . albuterol (PROVENTIL HFA;VENTOLIN HFA) 108 (90 BASE) MCG/ACT inhaler Inhale 2 puffs into the lungs every 6 (six) hours as needed for wheezing.  1 Inhaler  3  . aspirin 81 MG tablet Take 81 mg by mouth daily.      Marland Kitchen atorvastatin (LIPITOR) 40 MG tablet Take 1 tablet (40 mg total) by mouth daily.  30 tablet  3  . carvedilol (COREG) 12.5 MG tablet Take 1 tablet (12.5 mg total) by mouth 2 (two) times daily with a meal.  180 tablet  1  . citalopram (CELEXA) 20 MG tablet Take 20 mg by mouth daily.      . clopidogrel (PLAVIX) 75 MG tablet Take 1 tablet (75 mg total) by mouth daily.  90 tablet  1  . digoxin (LANOXIN) 0.125 MG tablet Take 1 tablet (0.125 mg total) by mouth daily.  90 tablet  3  . METHADONE HCL PO Take 60 mg by mouth.      . nitroGLYCERIN (NITROSTAT) 0.3 MG SL tablet Place 0.3 mg under the tongue every 5 (five) minutes as needed.      Marland Kitchen omeprazole (PRILOSEC OTC) 20 MG tablet Take 20 mg by  mouth daily.       No current facility-administered medications on file prior to visit.   Allergies  Allergen Reactions  . Tetracyclines & Related Rash   History   Social History  . Marital Status: Widowed    Spouse Name: N/A    Number of Children: N/A  . Years of Education: N/A   Social History Main Topics  . Smoking status: Former Smoker    Quit date: 10/04/1993  . Smokeless tobacco: Never Used  . Alcohol Use: No  . Drug Use: No  . Sexual Activity: None   Other Topics Concern  . None   Social History Narrative  . None   Family History  Problem Relation Age of Onset  . Heart disease Mother   . Stroke Mother   . Heart disease Sister   . Diabetes Sister   . Hypertension Sister   . Diabetes Maternal Aunt      Review of Systems  Constitutional: Negative for fever, chills, diaphoresis, activity change, appetite change, fatigue and unexpected weight change.  HENT: Negative for hearing  loss, ear pain, nosebleeds, congestion, sore throat, facial swelling, rhinorrhea, sneezing, drooling, mouth sores, trouble swallowing, neck pain, neck stiffness, dental problem, voice change, postnasal drip, sinus pressure, tinnitus and ear discharge.   Eyes: Negative for photophobia, pain, discharge, redness, itching and visual disturbance.  Respiratory: Negative for apnea, cough, choking, chest tightness, shortness of breath, wheezing and stridor.   Cardiovascular: Negative for chest pain, palpitations and leg swelling.  Gastrointestinal: Negative for nausea, vomiting, abdominal pain, diarrhea, constipation, blood in stool, abdominal distention, anal bleeding and rectal pain.  Genitourinary: Negative for dysuria, urgency, frequency, hematuria, flank pain, decreased urine volume, vaginal bleeding, vaginal discharge, enuresis, difficulty urinating, genital sores, vaginal pain, menstrual problem, pelvic pain and dyspareunia.  Musculoskeletal: Negative for myalgias, back pain, joint swelling, arthralgias and gait problem.  Skin: Negative for color change, pallor, rash and wound.  Neurological: Negative for dizziness, tremors, seizures, syncope, facial asymmetry, speech difficulty, weakness, light-headedness, numbness and headaches.  Hematological: Negative for adenopathy. Does not bruise/bleed easily.  Psychiatric/Behavioral: Negative for suicidal ideas, hallucinations, behavioral problems, confusion, sleep disturbance, self-injury, dysphoric mood, decreased concentration and agitation. The patient is not nervous/anxious and is not hyperactive.   All other systems reviewed and are negative.      BP 118/60  Pulse 68  Temp(Src) 98.4 F (36.9 C) (Oral)  Resp 16  Ht 5' (1.524 m)  Wt 113 lb (51.256 kg)  BMI 22.07 kg/m2  SpO2 94% Objective:   Physical Exam  Constitutional: She is oriented to person, place, and time. She appears well-developed and well-nourished. No distress.  HENT:  Head:  Normocephalic and atraumatic.  Right Ear: Tympanic membrane, external ear and ear canal normal.  Left Ear: Tympanic membrane, external ear and ear canal normal.  Nose: Nose normal. No mucosal edema or rhinorrhea.  Mouth/Throat: Uvula is midline, oropharynx is clear and moist and mucous membranes are normal. No posterior oropharyngeal erythema.  Eyes: Conjunctivae and EOM are normal. Pupils are equal, round, and reactive to light. Right eye exhibits no discharge. Left eye exhibits no discharge. No scleral icterus.  Neck: Normal range of motion. Neck supple. No thyromegaly present.  Cardiovascular: Normal rate, regular rhythm, normal heart sounds and intact distal pulses.   Pulmonary/Chest: Effort normal and breath sounds normal. No respiratory distress.  Abdominal: Soft. Bowel sounds are normal. There is no tenderness.  Musculoskeletal: She exhibits no edema.  Lymphadenopathy:    She has  no cervical adenopathy.  Neurological: She is alert and oriented to person, place, and time. She has normal reflexes.  Skin: Skin is warm and dry. She is not diaphoretic. No erythema.  Psychiatric: She has a normal mood and affect. Her behavior is normal.          Results for orders placed in visit on 04/11/13  POCT CBC      Result Value Range   WBC 6.8  4.6 - 10.2 K/uL   Lymph, poc 2.4  0.6 - 3.4   POC LYMPH PERCENT 35.8  10 - 50 %L   MID (cbc) 0.5  0 - 0.9   POC MID % 6.8  0 - 12 %M   POC Granulocyte 3.9  2 - 6.9   Granulocyte percent 57.4  37 - 80 %G   RBC 4.46  4.04 - 5.48 M/uL   Hemoglobin 13.5  12.2 - 16.2 g/dL   HCT, POC 95.2  84.1 - 47.9 %   MCV 95.9  80 - 97 fL   MCH, POC 30.3  27 - 31.2 pg   MCHC 31.5 (*) 31.8 - 35.4 g/dL   RDW, POC 32.4     Platelet Count, POC 254  142 - 424 K/uL   MPV 8.3  0 - 99.8 fL  POCT URINALYSIS DIPSTICK      Result Value Range   Color, UA yellow     Clarity, UA clear     Glucose, UA neg     Bilirubin, UA small     Ketones, UA neg     Spec Grav, UA  >=1.030     Blood, UA neg     pH, UA 5.0     Protein, UA neg     Urobilinogen, UA 0.2     Nitrite, UA neg     Leukocytes, UA Negative      Assessment & Plan:  COPD (chronic obstructive pulmonary disease) - Plan: cetirizine (ZYRTEC) 10 MG tablet, POCT CBC, Lipid panel - per pt PFTs monitored at cards.  Allergy history, radiographic dye  Routine general medical examination at a health care facility - Plan: TSH, POCT urinalysis dipstick; no colonoscopy prior in  chart - but pt very sure she had one by Dr. Loreta Ave and told she didn't need another for 10 yrs - definitely after 2007; TDaP - none prior in chart - done today; mammogram - none prior in chart so referred; f/u with Dr. Senaida Ores as indicated for pap smears.  Encounter for long-term (current) use of other medications - Plan: Comprehensive metabolic panel  Cardiovascular disease  - Plan: Lipid panel - Pt needs a copy of her labs sent to her, faxed to Dr. Herbie Baltimore at [facility], and sent to [facility]  Symptoms involving cardiovascular system  - Plan: TSH  Need for diphtheria-tetanus-pertussis (Tdap) vaccine - Plan: Tdap vaccine greater than or equal to 7yo IM  Other screening mammogram - Plan: MM Digital Screening  Meds ordered this encounter  Medications  . cetirizine (ZYRTEC) 10 MG tablet    Sig: Take 1 tablet (10 mg total) by mouth daily.    Dispense:  30 tablet    Refill:  11

## 2013-04-12 LAB — COMPREHENSIVE METABOLIC PANEL
AST: 23 U/L (ref 0–37)
Albumin: 4.3 g/dL (ref 3.5–5.2)
BUN: 21 mg/dL (ref 6–23)
CO2: 28 mEq/L (ref 19–32)
Calcium: 9.7 mg/dL (ref 8.4–10.5)
Chloride: 101 mEq/L (ref 96–112)
Creat: 0.86 mg/dL (ref 0.50–1.10)
Potassium: 4.8 mEq/L (ref 3.5–5.3)

## 2013-04-12 LAB — LIPID PANEL
Cholesterol: 140 mg/dL (ref 0–200)
HDL: 43 mg/dL (ref >39)
Total CHOL/HDL Ratio: 3.3 Ratio

## 2013-04-12 LAB — TSH: TSH: 2.106 u[IU]/mL (ref 0.350–4.500)

## 2013-05-02 ENCOUNTER — Ambulatory Visit: Payer: Medicare Other

## 2013-05-17 ENCOUNTER — Ambulatory Visit: Payer: Medicare Other

## 2013-05-23 ENCOUNTER — Encounter: Payer: Self-pay | Admitting: Family Medicine

## 2013-07-12 ENCOUNTER — Telehealth: Payer: Self-pay | Admitting: Cardiology

## 2013-07-12 NOTE — Telephone Encounter (Signed)
She is having swelling in both legs and they are looking a little bluish  Says mostly around her knees  Back of legs hurting. Please call

## 2013-07-12 NOTE — Telephone Encounter (Signed)
Spoke to patient . She has not contacted PCP, SHE STATES THE SWELLING IN LEGS IS CONSTANT .   APPOINTMENT MADE FOR extender on 07/13/2013 at 9 am

## 2013-07-13 ENCOUNTER — Encounter: Payer: Self-pay | Admitting: Cardiology

## 2013-07-13 ENCOUNTER — Ambulatory Visit (INDEPENDENT_AMBULATORY_CARE_PROVIDER_SITE_OTHER): Payer: Medicare Other | Admitting: Cardiology

## 2013-07-13 DIAGNOSIS — I2589 Other forms of chronic ischemic heart disease: Secondary | ICD-10-CM

## 2013-07-15 ENCOUNTER — Ambulatory Visit: Payer: Medicare Other

## 2013-07-15 ENCOUNTER — Ambulatory Visit (INDEPENDENT_AMBULATORY_CARE_PROVIDER_SITE_OTHER): Payer: Medicare Other | Admitting: Internal Medicine

## 2013-07-15 VITALS — BP 122/76 | HR 62 | Temp 98.7°F | Resp 17 | Ht 61.0 in | Wt 113.0 lb

## 2013-07-15 DIAGNOSIS — R58 Hemorrhage, not elsewhere classified: Secondary | ICD-10-CM

## 2013-07-15 DIAGNOSIS — I998 Other disorder of circulatory system: Secondary | ICD-10-CM

## 2013-07-15 DIAGNOSIS — M25569 Pain in unspecified knee: Secondary | ICD-10-CM

## 2013-07-15 DIAGNOSIS — D649 Anemia, unspecified: Secondary | ICD-10-CM

## 2013-07-15 DIAGNOSIS — R233 Spontaneous ecchymoses: Secondary | ICD-10-CM

## 2013-07-15 DIAGNOSIS — M25561 Pain in right knee: Secondary | ICD-10-CM

## 2013-07-15 DIAGNOSIS — K089 Disorder of teeth and supporting structures, unspecified: Secondary | ICD-10-CM | POA: Insufficient documentation

## 2013-07-15 LAB — POCT CBC
HCT, POC: 35.9 % — AB (ref 37.7–47.9)
Lymph, poc: 1.9 (ref 0.6–3.4)
MCH, POC: 29.7 pg (ref 27–31.2)
MCHC: 30.9 g/dL — AB (ref 31.8–35.4)
MCV: 96.1 fL (ref 80–97)
MID (cbc): 1 — AB (ref 0–0.9)
POC LYMPH PERCENT: 20.3 %L (ref 10–50)
Platelet Count, POC: 265 10*3/uL (ref 142–424)
RDW, POC: 13.8 %

## 2013-07-15 NOTE — Progress Notes (Addendum)
Subjective:  This chart was scribed for Cheryl Sia, MD by Quintella Reichert, ED scribe.  This patient was seen in room [facility] Room 10 and the patient's care was started at 3:33 PM.   Patient ID: [name], female    DOB: 01/05/1951, 62 y.o.   MRN: [id number]  HPI  HPI Comments: [name] is a 62 y.o. female with h/o CHF, MI, ischemic cardiomyopathy, CAD and HTN (on Plavix) who presents with a chief complaint of bilateral knee bruising and swelling that began this week.  Pt denies any recent falls or injuries that may have caused bruising.  She also complains of mild knee pain when bending, which she attributes to the swelling.  She has been limping slightly due to pain.  She denies prior h/o knee pain.  She denies coldness or numbness in feet.  She has been keeping her legs elevated.  She states she may have also had some fevers recently. No unsteady gait. No confusion. Here w/ daughter who confirms story.   She denies any other recent associated symptoms including CP, SOB, or abdominal pain.  Pt has h/o triple-bypass and states "while I was recovering they all closed up."  She had stents placed in a second surgery.  She has been on Plavix since her surgery which was several years ago (2008)    Denies etoh/other drugs  She had a cholecystectomy on 09/01/12 by Dr. Luisa Hart.    Past Medical History  Diagnosis Date  . Hypertension   . Myocardial infarction 2005  . Dysrhythmia     afib  . Depression   . Anxiety   . Pneumonia   . Hepatitis A   . Measles   . Chicken pox   . Hyperlipidemia   . Joint pain     both legs  . Herpes   . Insomnia   . CHF (congestive heart failure)   . COPD (chronic obstructive pulmonary disease)---smoked long term til 2007/quit   . Methadone maintenance--long term for opiates//is on declining dose thru [facility]       Medication List       This list is accurate as of: 07/15/13  3:39 PM.  Always use your most recent med list.                albuterol 108 (90 BASE) MCG/ACT inhaler  Commonly known as:  PROVENTIL HFA;VENTOLIN HFA  Inhale 2 puffs into the lungs every 6 (six) hours as needed for wheezing.     aspirin 81 MG tablet  Take 81 mg by mouth daily.     atorvastatin 40 MG tablet  Commonly known as:  LIPITOR  Take 1 tablet (40 mg total) by mouth daily.     carvedilol 12.5 MG tablet  Commonly known as:  COREG  Take 1 tablet (12.5 mg total) by mouth 2 (two) times daily with a meal.     cetirizine 10 MG tablet  Commonly known as:  ZYRTEC  Take 1 tablet (10 mg total) by mouth daily.     citalopram 20 MG tablet  Commonly known as:  CELEXA  Take 20 mg by mouth daily.     clopidogrel 75 MG tablet  Commonly known as:  PLAVIX  Take 1 tablet (75 mg total) by mouth daily.     digoxin 0.125 MG tablet  Commonly known as:  LANOXIN  Take 1 tablet (0.125 mg total) by mouth daily.     METHADONE HCL PO  Take 60 mg  by mouth.     nitroGLYCERIN 0.3 MG SL tablet  Commonly known as:  NITROSTAT  Place 0.3 mg under the tongue every 5 (five) minutes as needed.     omeprazole 20 MG tablet  Commonly known as:  PRILOSEC OTC  Take 20 mg by mouth daily.        History   Social History  . Marital Status: Widowed    Spouse Name: N/A    Number of Children: N/A  . Years of Education: N/A   Social History Main Topics  . Smoking status: Former Smoker    Quit date: 10/04/1993  . Smokeless tobacco: Never Used  . Alcohol Use: No  . Drug Use: No  . Sexual Activity: Not on file    Past Surgical History  Procedure Laterality Date  . Coronary artery bypass graft    . Diagnostic laparoscopy    . Eus  04/14/2012    Procedure: UPPER ENDOSCOPIC ULTRASOUND (EUS) LINEAR;  Surgeon: Theda Belfast, MD;  Location: [facility];  Service: Endoscopy;  Laterality: N/A;  . Esophagogastroduodenoscopy  04/14/2012    Procedure: ESOPHAGOGASTRODUODENOSCOPY (EGD);  Surgeon: Theda Belfast, MD;  Location: [facility];  Service: Endoscopy;   Laterality: N/A;  . Cholecystectomy  09/01/2012    Procedure: LAPAROSCOPIC CHOLECYSTECTOMY WITH INTRAOPERATIVE CHOLANGIOGRAM;  Surgeon: Clovis Pu. Cornett, MD;  Location: [facility] OR;  Service: General;  Laterality: N/A;    Family History  Problem Relation Age of Onset  . Heart disease Mother   . Stroke Mother   . Heart disease Sister   . Diabetes Sister   . Hypertension Sister   . Diabetes Maternal Aunt     Allergies  Allergen Reactions  . Tetracyclines & Related Rash     Review of Systems  Constitutional: Negative for fever, activity change, appetite change, fatigue and unexpected weight change.       No dietary chg  HENT:       Hx dental problems currently being worked on  Eyes: Negative for visual disturbance.  Respiratory: Negative for shortness of breath.   Cardiovascular: Negative for chest pain and palpitations.  Gastrointestinal: Negative for abdominal pain.       No melena or hematochezia/colonoscopy due next year  Genitourinary: Negative for difficulty urinating.  Neurological: Negative for syncope, light-headedness and headaches.  Hematological: Negative for adenopathy.       Admits some bleeding from gums with brushing, but no other bleeding or bruising  Psychiatric/Behavioral: Negative for confusion, sleep disturbance, self-injury and decreased concentration.       Objective:   Physical Exam  Nursing note and vitals reviewed. Constitutional: She is oriented to person, place, and time. She appears well-developed and well-nourished. No distress.  But she has a pale complexion without obvious jaundice or scleral scleral icterus  HENT:  Teeth in terrible condition with several decayed to gum line but no abscesses  Eyes: Conjunctivae and EOM are normal. Pupils are equal, round, and reactive to light.  Neck: Neck supple. No thyromegaly present.  Cardiovascular: Normal rate, regular rhythm and normal heart sounds.   No murmur heard. Soft right femoral  bruit Bilateral femoral pulses intact No abdominal bruit Lower extr pulses diminished  Pulmonary/Chest: Effort normal. No respiratory distress.  Abdominal: Soft. Bowel sounds are normal. She exhibits no distension and no mass. There is no tenderness. There is no rebound and no guarding.  Musculoskeletal: She exhibits no edema and no tenderness.  Both knees swollen with ecchymoses anteriorly and posteriorly.  Mild discomfort with ROM due to swelling.  No laxity.  No masses or cords in lower extremities.  Some ecchymoses to posterior thigh bilaterally.  Peripheral pulses are diminished but symmetrical.  Lymphadenopathy:    She has no cervical adenopathy.  Neurological: She is alert and oriented to person, place, and time. No cranial nerve deficit. She exhibits normal muscle tone.  Skin: No erythema.  Scattered non-blanching petechiae on anterior shins. Good capillary refill in feet, without cyanosis. Skin over the legs has doughy feel as does the abdomen  Psychiatric: She has a normal mood and affect. Her behavior is normal.  gait not antalgic   BP 122/76  Pulse 62  Temp(Src) 98.7 F (37.1 C) (Oral)  Resp 17  Ht 5\' 1"  (1.549 m)  Wt 113 lb (51.256 kg)  BMI 21.36 kg/m2  SpO2 96%  [facility] reading (PRIMARY) by  Dr. Josephina Gip bony abnormality/note clips from vein harvesting..       Assessment & Plan:  Ecchymosis around knees anteriorly and posteriorly  Petechiae-lower extremities nonblanching  Knee pain, bilateral -due to mild effusion /swelling On Plavix since 2008 Normocytic anemia Terrible dentition--but no murmur to suggest SBE  On presentation suggestive of bleeding from trauma or clotting abnormality Because platelets are normal will proceed with PT and PTT and assess liver function Stool for blood will be obtained due to the anemia plus fe/tibc ANA and sedimentation rate will be obtained since scleroderma must be a consideration  50 min OV I have completed the patient  encounter in its entirety as documented by the scribe, with editing by me where necessary. Devansh Riese P. Merla Riches, M.D.    Addend-07/17/13= Labs ok x esr in 60s and mild anemia LFTs have ret to [facility]  etio must be unrecognized trauma vs platelet dysfunction from plavix, vs CV disease Will consult w/ Dr Myna Hidalgo and Dr Herbie Baltimore before any med change

## 2013-07-16 LAB — IRON AND TIBC
%SAT: 15 % — ABNORMAL LOW (ref 20–55)
Iron: 49 ug/dL (ref 42–145)
TIBC: 327 ug/dL (ref 250–470)
UIBC: 278 ug/dL (ref 125–400)

## 2013-07-16 LAB — APTT: aPTT: 34 seconds (ref 24–37)

## 2013-07-16 LAB — COMPREHENSIVE METABOLIC PANEL
Alkaline Phosphatase: 105 U/L (ref 39–117)
BUN: 29 mg/dL — ABNORMAL HIGH (ref 6–23)
CO2: 27 mEq/L (ref 19–32)
Creat: 1.05 mg/dL (ref 0.50–1.10)
Glucose, Bld: 86 mg/dL (ref 70–99)
Total Bilirubin: 1.2 mg/dL (ref 0.3–1.2)
Total Protein: 7.2 g/dL (ref 6.0–8.3)

## 2013-07-16 LAB — PROTIME-INR
INR: 1.17 (ref <1.50)
Prothrombin Time: 14.8 seconds (ref 11.6–15.2)

## 2013-07-16 NOTE — Progress Notes (Signed)
Encounter opened in error  Laporche Martelle [facility] PA-C 07/16/2013 7:52 AM

## 2013-07-17 ENCOUNTER — Ambulatory Visit (INDEPENDENT_AMBULATORY_CARE_PROVIDER_SITE_OTHER): Payer: Medicare Other | Admitting: Cardiology

## 2013-07-17 ENCOUNTER — Ambulatory Visit (HOSPITAL_COMMUNITY)
Admission: RE | Admit: 2013-07-17 | Discharge: 2013-07-17 | Disposition: A | Discharge status: Home / Self Care | Payer: Medicare Other | Source: Ambulatory Visit | Attending: Cardiology | Admitting: Cardiology

## 2013-07-17 ENCOUNTER — Encounter: Payer: Self-pay | Admitting: Cardiology

## 2013-07-17 VITALS — BP 132/78 | HR 65 | Ht 61.0 in | Wt 114.9 lb

## 2013-07-17 DIAGNOSIS — M79609 Pain in unspecified limb: Secondary | ICD-10-CM

## 2013-07-17 DIAGNOSIS — S8000XA Contusion of unspecified knee, initial encounter: Secondary | ICD-10-CM | POA: Insufficient documentation

## 2013-07-17 DIAGNOSIS — I10 Essential (primary) hypertension: Secondary | ICD-10-CM

## 2013-07-17 DIAGNOSIS — M7989 Other specified soft tissue disorders: Secondary | ICD-10-CM

## 2013-07-17 DIAGNOSIS — I255 Ischemic cardiomyopathy: Secondary | ICD-10-CM

## 2013-07-17 DIAGNOSIS — X58XXXA Exposure to other specified factors, initial encounter: Secondary | ICD-10-CM | POA: Insufficient documentation

## 2013-07-17 DIAGNOSIS — I2589 Other forms of chronic ischemic heart disease: Secondary | ICD-10-CM

## 2013-07-17 DIAGNOSIS — M25469 Effusion, unspecified knee: Secondary | ICD-10-CM

## 2013-07-17 DIAGNOSIS — E785 Hyperlipidemia, unspecified: Secondary | ICD-10-CM

## 2013-07-17 DIAGNOSIS — I2510 Atherosclerotic heart disease of native coronary artery without angina pectoris: Secondary | ICD-10-CM

## 2013-07-17 LAB — ANA: Anti Nuclear Antibody(ANA): NEGATIVE

## 2013-07-17 NOTE — Progress Notes (Signed)
Venous Duplex Lower Ext. Completed for reflux. Marilynne Halsted, BS, RDMS, RVT

## 2013-07-17 NOTE — Addendum Note (Signed)
Addended by: Tonye Pearson on: 07/17/2013 10:43 PM   Modules accepted: Orders

## 2013-07-17 NOTE — Patient Instructions (Signed)
I am somewhat perplexed as to was going on with your legs. With all the bruising I do think it's a good idea to stop her Plavix for about 3 weeks to allow healing to occur.  I want to check artery and vein Dopplers of the legs to make sure there is no ruptured vein or ruptured Baker's cyst that could be explaining these findings.  I will also wonder refer you to orthopedic surgery to evaluate your knees.  Otherwise it seems like from our perspective you're doing well.  Her blood pressure is well-controlled and her cholesterol (with the exception of your triglycerides) looked great from July.  I'll have you check back in a me in about 6-8 weeks to see how the legs are doing.  Otherwise, we can do yearly followup.  Marykay Lex, MD

## 2013-07-18 ENCOUNTER — Encounter: Payer: Self-pay | Admitting: Cardiology

## 2013-07-18 DIAGNOSIS — I10 Essential (primary) hypertension: Secondary | ICD-10-CM | POA: Insufficient documentation

## 2013-07-18 DIAGNOSIS — Z87891 Personal history of nicotine dependence: Secondary | ICD-10-CM | POA: Insufficient documentation

## 2013-07-18 DIAGNOSIS — E785 Hyperlipidemia, unspecified: Secondary | ICD-10-CM | POA: Insufficient documentation

## 2013-07-18 NOTE — Assessment & Plan Note (Addendum)
Overall pretty stable today. No active anginal heart failure symptoms. She remains on aspirin plus Plavix. She is on beta blocker but not an ACE inhibitor. His also on statin with labs recently checked.  Plan: Continue current management for him as long as her symptoms are stable.

## 2013-07-18 NOTE — Progress Notes (Signed)
Thank you for the followup note on [name]. Please keep me informed about her

## 2013-07-18 NOTE — Progress Notes (Signed)
PATIENT: [name] MRN: [id number]  DOB: [birth year]   DOV:07/18/2013 PCP: Norberto Sorenson, MD  Clinic Note: Chief Complaint  Patient presents with  . 3 MONTH VISIT    NO CHEST PAIN , NO SOB , EDEMA  FEET AND ANKLES, BRUISING BOTH KNEES ALL THE AWAY ROUND.PAINFUL KNEES WHEN IN ACTIVITY   HPI: [name] is a 62 y.o. female with a PMH below who presents today for annual followup but with a complaint of bilateral knee swelling, pain and bruising. She is a long-term patient of our clinic. I am here to 09 moved in town. She has long-standing ischemic cardiomyopathy with a EF is probably in the 40-45 range based on her nuclear stress tests in the past. She has a long-standing cardiac history the documented below under past medical history. Basically she had a heart catheterization done for an abnormal Myoview in January 2005 that resulted her undergoing CABG. 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 Less than 3 months later, she presented with a non-ST elevation MI with 203 vein grafts occluded and significant disease in the vein into the right at the insertion site as well as insertion site disease of the LIMA. She underwent extensive staged PCI at that time and had a relatively good outcome only 10% 2 months later with inferior STEMI. She had extensive PCI of the RCA done at bedtime, but within the same muscles are stent reocclusion of the RCA at the stent sites. She is maintained medically ever since. She is also extremely thin and frail, but has maintained relatively good and solid weight to that I known her. Despite her ischemic cardiomyopathy, she has not had any hospitalizations for heart failure in the last 9 years. She had GI evaluations and then eventually had cholecystectomy back in November of 2013 that she tolerated very well.  Interval History: She just today with no real cardiac symptoms to speak of. Her biggest issue is that over last couple days she is had  significant swelling and bruising of both knees the right greater than left Addison Lank is a swelling below the knees. In does not recall any recent falls or trauma to the knees. She's also been doing any significant exercising to explain it. Otherwise her cardiac standpoint she is relatively stable as follows: Cardiovascular ROS:  no chest pain or dyspnea on exertion positive for - Mild exertional dyspnea if she really pushes it. Also notably significant bruising due to Plavix. negative for - edema, irregular heartbeat, loss of consciousness, murmur, palpitations, paroxysmal nocturnal dyspnea, rapid heart rate, shortness of breath or She sleeps on 2 pillows mostly for comfort, not because of orthopnea.: Additional cardiac review of systems: Lightheadedness - no, dizziness - no, syncope/near-syncope - no; TIA/amaurosis fugax - no Melena - no, hematochezia no; hematuria - no; nosebleeds - no; claudication - no   Past Medical History  Diagnosis Date  . CAD in native artery January 2005    For abnormal Myoview: Diffuse proximal LAD 60-speed percent, ramus intermedius 90%, AV cervix and 95% with OM1 having 70%, RCA with diffuse disease in the mid and distal mid RCA as well as PL and PDA --> referred for CABG  . 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 Non-STEMI (non-ST elevated myocardial infarction)  March 2005    60-70 and 80% proximal LAD, 80% diagonal; 90% eccentric circumflex, 70% RCA and 90% PLA; LIMA  insertion 95% with 60% LAD stenosis at IMA takeoff, SVG-ramus and SVG-diagonal occluded, 90% insertion site stenosis and RCA  . CAD S/P percutaneous coronary angioplasty March 2005    Stage multisite PCI of both native RCA (with Cutting Balloon PTCA of RPL) as well as restoration of now occluded SVG-RPDA; staged PCI of AV groove circumflex, and Cutting Balloon PTCA of the IMA anastomosis.  . ST elevation myocardial infarction (STEMI)  of inferior wall May 2005    Diffuse disease in native RCA, with occluded SVG-RPDA --> extensive PCI of native RCA with PTCA of PLA.   Marland Kitchen History of: Shock, cardiogenic May 2005    Balloon pump insertion, catheterization revealed mid occlusion of native RCA, patent circumflex stent, and insertion site of IMA to LAD stenosis.; No further PCI performed. Medical therapy from that point forward.  . Ischemic cardiomyopathy May 2005    EF originally 30th of 35%; echo April 2013: EF 35-40% moderate apical, septal, inferior HK. Mild to moderate reduced RV function. No significant lesions only mild to moderate MR.  Marland Kitchen Abnormal nuclear stress test May 2013    LexiScan Myoview: Negative for ischemia. Large infarction noted in the basal inferior, mid inferior, inferolateral and anterolateral walls.  . Combined systolic and diastolic congestive heart failure, NYHA class 2   . Hyperlipidemia   . Hypertension, essential     both legs  . COPD (chronic obstructive pulmonary disease) with emphysema   . Insomnia   . Pneumonia      history of  . Depression with anxiety   . 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 Former heavy cigarette smoker (20-39 per day)     Quit in 2005  . History of hepatitis A   . Herpes   . History of measles   . History of chickenpox   . History of vertigo    Prior Cardiac Evaluation and Past Surgical History: Past Surgical History  Procedure Laterality Date  . Coronary artery bypass graft  January 2005  . Diagnostic laparoscopy    . Eus  04/14/2012    Procedure: UPPER ENDOSCOPIC ULTRASOUND (EUS) LINEAR;  Surgeon: Theda Belfast, MD;  Location: [facility];  Service: Endoscopy;  Laterality: N/A;  . Esophagogastroduodenoscopy  04/14/2012    Procedure: ESOPHAGOGASTRODUODENOSCOPY (EGD);  Surgeon: Theda Belfast, MD;  Location: [facility];  Service: Endoscopy;  Laterality: N/A;  . Cholecystectomy  09/01/2012     Procedure: LAPAROSCOPIC CHOLECYSTECTOMY WITH INTRAOPERATIVE CHOLANGIOGRAM;  Surgeon: Clovis Pu. Cornett, MD;  Location: [facility] OR;  Service: General;  Laterality: N/A;    Allergies  Allergen Reactions  . Tetracyclines & Related Rash    Current Outpatient Prescriptions  Medication Sig Dispense Refill  . aspirin 81 MG tablet Take 81 mg by mouth daily.      Marland Kitchen atorvastatin (LIPITOR) 40 MG tablet Take 1 tablet (40 mg total) by mouth daily.  30 tablet  3  . carvedilol (COREG) 12.5 MG tablet Take 1 tablet (12.5 mg total) by mouth 2 (two) times daily with a meal.  180 tablet  1  . cetirizine (ZYRTEC) 10 MG tablet Take 1 tablet (10 mg total) by mouth daily.  30 tablet  11  . citalopram (CELEXA) 20 MG tablet Take 20 mg by mouth daily.      . clopidogrel (PLAVIX) 75 MG tablet Take 1 tablet (75 mg total) by mouth daily.  90 tablet  1  .  digoxin (LANOXIN) 0.125 MG tablet Take 1 tablet (0.125 mg total) by mouth daily.  90 tablet  3  . METHADONE HCL PO Take 60 mg by mouth.      . nitroGLYCERIN (NITROSTAT) 0.3 MG SL tablet Place 0.3 mg under the tongue every 5 (five) minutes as needed.      Marland Kitchen omeprazole (PRILOSEC OTC) 20 MG tablet Take 20 mg by mouth daily.      Marland Kitchen albuterol (PROVENTIL HFA;VENTOLIN HFA) 108 (90 BASE) MCG/ACT inhaler Inhale 2 puffs into the lungs every 6 (six) hours as needed for wheezing.  1 Inhaler  3   No current facility-administered medications for this visit.    History   Social History Narrative   She is a divorced mother of 2, grandmother 2. She routinely exercises, does not all that much.   She is a former heavy smoker, who quit smoking in 2005 at the time her catastrophic cardiac events.   Former alcohol drinker, does not drink currently.   Former heroin abuser, now maintained on methadone the methadone clinic.    ROS: A comprehensive Review of Systems - Negative except Pertinent positive symptoms noted in the history of present illness. She's not had any difficulties with heart  failure, or bleeding. No changes in bowel or bladder habits. She has been stable at the methadone clinic without problems. No recent illnesses or infections.  PHYSICAL EXAM BP 132/78  Pulse 65  Ht 5\' 1"  (1.549 m)  Wt 114 lb 14.4 oz (52.118 kg)  BMI 21.72 kg/m2 General appearance: alert, cooperative, appears stated age, no distress and Pain and relatively frail-appearing. Appears older than her stated age actually. Answers questions appropriately. Neck: no adenopathy, no carotid bruit, no JVD and supple, symmetrical, trachea midline Lungs: clear to auscultation bilaterally, normal percussion bilaterally and With mild interstitial sounds. Nonlabored, good air movement Heart: regular rate and rhythm, S1, S2 normal, no murmur, click, rub or gallop and normal apical impulse Abdomen: soft, non-tender; bowel sounds normal; no masses,  no organomegaly Extremities: Both knees have extensive swelling with the right greater than left having extensive ecchymosis. The ecchymosis is most the along the front of the knee and then also the back of the right. Is also circumferential silk bruising on the right and left. There is no notable swelling below the knees and ankles the feet. Is also no significant swelling above the knees. Pulses: Diminished DP and PT pulses bilaterally. Skin: mild bruising throughout but mostly in the in the knees as noted above. Neurologic: Grossly normal;  mild strabismus with not quite equal extra of her muscles.   [id number] today: Yes Rate: 65 , Rhythm: normal sinus rhythm, poor anterior R wave progression, inferior and lateral T-wave inversions   Conduction Delay  there is apparent a vertical reduction the leg with the most was left on the right arm. The nonspecific ST-T changes are likely due to the conduction delay.   Recent Labs:  none recently checked. She does have labs from July of this year that has and HDL 43 LDL 47 triglycerides 248.   ASSESSMENT / PLAN: CAD  (coronary artery disease) - CABG in 2005 --> none STEMI followed by stenting with extensive PCI to RCA as well as finger to the RCA. Also noted Cx disease with a stent that was patent, as  Overall pretty stable today. No active anginal heart failure symptoms. She remains on aspirin plus Plavix. She is on beta blocker but not an ACE inhibitor. His also on  statin with labs recently checked.  Plan: Continue current management for him as long as her symptoms are stable.  Joint swelling of lower leg - with bruising I really don't know what to make of this finding. I think we need to delineate if there is any potential venous or arterial problems. Ordered lower extremity venous and venous insufficiency Dopplers as well as arterial Dopplers. For these she should be referred to orthopedic surgery.   I think to be safe for her to stop her Plavix for least 3 weeks in order to allow some of the bruising to reduce. Am concerned of possible hemarthrosis. Other possibility could be ruptured Baker's cyst.  She may very well need additional pain control, but with her being in the methadone clinic I would have to defer to her "pain physician "  Hyperlipidemia On statin, and was relatively well controlled last visit except the triglycerides being elevated. She'll be due for a check in about 3 months. Would consider fenofibrate  Hypertension, essential Relatively well controlled on current regimen. She remains on carvedilol 12.5 twice a day and stable.  We may need to be further adjustments this summer her current symptoms bring..    Orders Placed This Encounter  Procedures  . Ambulatory referral to Orthopedic Surgery    Referral Priority:  Routine    Referral Type:  Surgical    Referral Reason:  Specialty Services Required    Requested Specialty:  Orthopedic Surgery    Number of Visits Requested:  1  . EKG 12-Lead  . EKG 12-Lead    This order was created through External Result Entry  . Lower  Extremity Venous Duplex Bilateral    Standing Status: Future     Number of Occurrences:      Standing Expiration Date: 07/17/2014    Order Specific Question:  Laterality    Answer:  Bilateral    Order Specific Question:  Where should this test be performed:    Answer:  [facility]  . Lower Extremity Venous Reflux Bilateral    Standing Status: Future     Number of Occurrences: 1     Standing Expiration Date: 07/17/2014    Order Specific Question:  Laterality    Answer:  Bilateral    Order Specific Question:  Where should this test be performed:    Answer:  [facility]  . Lower Extremity Arterial Duplex Bilateral    Standing Status: Future     Number of Occurrences:      Standing Expiration Date: 07/17/2014    Order Specific Question:  Laterality    Answer:  Bilateral    Order Specific Question:  Where should this test be performed:    Answer:  [facility]   No orders of the defined types were placed in this encounter.    Followup:  6-8 weeks   DAVID W. Herbie Baltimore, M.D., M.S. [facility] [address] [place], [place]  [address]  [phone] Pager # [phone]

## 2013-07-18 NOTE — Assessment & Plan Note (Addendum)
Relatively well controlled on current regimen. She remains on carvedilol 12.5 twice a day and stable.  We may need to be further adjustments this summer her current symptoms bring.Marland Kitchen

## 2013-07-18 NOTE — Assessment & Plan Note (Signed)
On statin, and was relatively well controlled last visit except the triglycerides being elevated. She'll be due for a check in about 3 months. Would consider fenofibrate

## 2013-07-18 NOTE — Assessment & Plan Note (Signed)
I really don't know what to make of this finding. I think we need to delineate if there is any potential venous or arterial problems. Ordered lower extremity venous and venous insufficiency Dopplers as well as arterial Dopplers. For these she should be referred to orthopedic surgery.   I think to be safe for her to stop her Plavix for least 3 weeks in order to allow some of the bruising to reduce. Am concerned of possible hemarthrosis. Other possibility could be ruptured Baker's cyst.  She may very well need additional pain control, but with her being in the methadone clinic I would have to defer to her "pain physician "

## 2013-07-19 ENCOUNTER — Inpatient Hospital Stay (HOSPITAL_COMMUNITY): Admission: RE | Admit: 2013-07-19 | Payer: Medicare Other | Source: Ambulatory Visit

## 2013-08-09 ENCOUNTER — Ambulatory Visit (HOSPITAL_COMMUNITY)
Admission: RE | Admit: 2013-08-09 | Discharge: 2013-08-09 | Disposition: A | Discharge status: Home / Self Care | Payer: Medicare Other | Source: Ambulatory Visit | Attending: Cardiovascular Disease | Admitting: Cardiovascular Disease

## 2013-08-09 DIAGNOSIS — M25469 Effusion, unspecified knee: Secondary | ICD-10-CM

## 2013-08-09 DIAGNOSIS — M79609 Pain in unspecified limb: Secondary | ICD-10-CM | POA: Insufficient documentation

## 2013-08-09 DIAGNOSIS — I739 Peripheral vascular disease, unspecified: Secondary | ICD-10-CM | POA: Insufficient documentation

## 2013-08-09 NOTE — Progress Notes (Signed)
Lower Extremity Arterial Duplex Completed. °Brianna L Mazza,RVT °

## 2013-08-16 ENCOUNTER — Telehealth: Payer: Self-pay | Admitting: *Deleted

## 2013-08-16 NOTE — Telephone Encounter (Signed)
Spoke to patient. Result given . Verbalized understanding  Patient asked when should she restart Plavix.will defer to Dr Herbie Baltimore. Patient states she did go to the ortho referral. They did do x-rays and told her to wear compression knee hi stocking.  Patient states she has been wearing the stockings and bruising and edema is little better ,but has edema in the back of her knee.

## 2013-08-16 NOTE — Telephone Encounter (Signed)
Spoke to patient . Per Dr Herbie Baltimore  Hold plavix  For  4 weeks total then start.patient verbalized understanding.

## 2013-08-16 NOTE — Telephone Encounter (Signed)
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        R side not as significant.            Marykay Lex, MD       ------

## 2013-08-16 NOTE — Telephone Encounter (Signed)
Give it a total of ~4 weeks off of Plavix to allow bruising to heal.  Marykay Lex, MD

## 2013-09-05 ENCOUNTER — Encounter: Payer: Self-pay | Admitting: Cardiology

## 2013-09-06 ENCOUNTER — Ambulatory Visit (INDEPENDENT_AMBULATORY_CARE_PROVIDER_SITE_OTHER): Payer: Medicare Other | Admitting: Cardiology

## 2013-09-06 ENCOUNTER — Encounter: Payer: Self-pay | Admitting: Cardiology

## 2013-09-06 VITALS — BP 128/70 | HR 64 | Ht 61.0 in | Wt 120.0 lb

## 2013-09-06 DIAGNOSIS — I2119 ST elevation (STEMI) myocardial infarction involving other coronary artery of inferior wall: Secondary | ICD-10-CM

## 2013-09-06 DIAGNOSIS — E785 Hyperlipidemia, unspecified: Secondary | ICD-10-CM

## 2013-09-06 DIAGNOSIS — Z79899 Other long term (current) drug therapy: Secondary | ICD-10-CM

## 2013-09-06 DIAGNOSIS — I2589 Other forms of chronic ischemic heart disease: Secondary | ICD-10-CM

## 2013-09-06 DIAGNOSIS — M25469 Effusion, unspecified knee: Secondary | ICD-10-CM

## 2013-09-06 DIAGNOSIS — I255 Ischemic cardiomyopathy: Secondary | ICD-10-CM

## 2013-09-06 DIAGNOSIS — E782 Mixed hyperlipidemia: Secondary | ICD-10-CM

## 2013-09-06 DIAGNOSIS — I2510 Atherosclerotic heart disease of native coronary artery without angina pectoris: Secondary | ICD-10-CM

## 2013-09-06 DIAGNOSIS — I10 Essential (primary) hypertension: Secondary | ICD-10-CM

## 2013-09-06 NOTE — Patient Instructions (Signed)
Labs CMP,LIPID WATCH FOR THE SYMPTOMS IF RETURN WEAR THE COMPRESSION STOCKING THAT YOU HAVE ALREADY.   Your physician wants you to follow-up in 6 MONTHS Dr Herbie Baltimore.  You will receive a reminder letter in the mail two months in advance. If you don't receive a letter, please call our office to schedule the follow-up appointment.

## 2013-09-07 ENCOUNTER — Other Ambulatory Visit: Payer: Self-pay | Admitting: Cardiology

## 2013-09-07 NOTE — Telephone Encounter (Signed)
Rx was sent to pharmacy electronically. 

## 2013-09-08 ENCOUNTER — Encounter: Payer: Self-pay | Admitting: Cardiology

## 2013-09-08 NOTE — Assessment & Plan Note (Signed)
She is on a statin. We will followup her lipid panel and LFTs prior to her next visit.

## 2013-09-08 NOTE — Progress Notes (Signed)
PATIENT: [name] MRN: [id number]  DOB: 1951/04/16   DOV:09/08/2013 PCP: Norberto Sorenson, MD  Clinic Note: Chief Complaint  Patient presents with  . 6 weeks follow up     lower extremities dopplers done;  no chest pain or discomfort , no sob , no sweling in knees are better   HPI: [name] is a 62 y.o.  female with a PMH below who presents today for 49-month followup after she presented with significant bruising and swelling of both legs at the knees. We stopped her Plavix to allow for the group bruising to heal. I sent her to deal with the surgeons to see if there's any hemarthrosis, that was thought to be fine. We did lotion the arterial and venous Dopplers. She now presents for followup on these findings.  Interval History: Since I last saw her, the bruising has dramatically improved it is all but gone. The swelling is also pretty much significantly improved. She has restarted the Plavix after stopping for 5 weeks. There was no finding to suggest a Baker's cyst.  She is now back to doing her baseline levels of activity. Again if she pushes herself with a lot of stress she will get all dyspnea on exertion otherwise no chest pain or shortness of breath with rest or exertion. Cardiovascular ROS: no chest pain or dyspnea on exertion negative for - edema, irregular heartbeat, loss of consciousness, murmur, orthopnea, palpitations, paroxysmal nocturnal dyspnea, rapid heart rate or shortness of breath: Additional cardiac review of systems: Lightheadedness - no, dizziness - no, syncope/near-syncope - no; TIA/amaurosis fugax - no Melena - no, hematochezia no; hematuria - no; nosebleeds - no; claudication - nothing notable  Past Medical History  Diagnosis Date  . CAD in native artery January 2005    For abnormal Myoview: Diffuse proximal LAD 60-speed percent, ramus intermedius 90%, AV cervix and 95% with OM1 having 70%, RCA with diffuse disease in the mid and distal mid RCA as well as PL  and PDA --> referred for CABG  . 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 Non-STEMI (non-ST elevated myocardial infarction)  March 2005    60-70 and 80% proximal LAD, 80% diagonal; 90% eccentric circumflex, 70% RCA and 90% PLA; LIMA insertion 95% with 60% LAD stenosis at IMA takeoff, SVG-ramus and SVG-diagonal occluded, 90% insertion site stenosis and RCA  . CAD S/P percutaneous coronary angioplasty March 2005    Stage multisite PCI of both native RCA (with Cutting Balloon PTCA of RPL) as well as restoration of now occluded SVG-RPDA; staged PCI of AV groove circumflex, and Cutting Balloon PTCA of the IMA anastomosis.  . ST elevation myocardial infarction (STEMI) of inferior wall May 2005    Diffuse disease in native RCA, with occluded SVG-RPDA --> extensive PCI of native RCA with PTCA of PLA.   Marland Kitchen History of: Shock, cardiogenic May 2005    Balloon pump insertion, catheterization revealed mid occlusion of native RCA, patent circumflex stent, and insertion site of IMA to LAD stenosis.; No further PCI performed. Medical therapy from that point forward.  . Ischemic cardiomyopathy May 2005    EF originally 30th of 35%; F/U ECHO: April 2013: EF 35-40% moderate apical, septal, inferior HK. Mild to moderate reduced RV function. Mild to moderate MR/TR  . Abnormal nuclear stress test May 2013    LexiScan Myoview: Negative for ischemia. Large infarction noted in the basal inferior, mid inferior,  inferolateral and anterolateral walls.  . Combined systolic and diastolic congestive heart failure, NYHA class 2   . Hyperlipidemia   . Hypertension, essential     both legs  . COPD (chronic obstructive pulmonary disease) with emphysema   . Insomnia   . Pneumonia      history of  . Depression with anxiety   . 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 Former heavy cigarette smoker  (20-39 per day)     Quit in 2005  . History of hepatitis A   . Herpes   . History of measles   . History of chickenpox   . History of vertigo    Prior Cardiac Evaluation and Past Surgical History: Reviewed Pertinent recent studies:  Lower Extremity arterial Dopplers: Mild arterial insufficiency with ABIs (right 0.96, left 0.91). Right common iliac 0-49%, left common iliac 59-60% which is a slight increase from last evaluation  No thrombus or thrombophlebitis. Valvular insufficiency noted in the deep common femoral, femoral and popliteal veins bilaterally.   Right and Left GSV - valvular insufficiency (R, >3 sec in proximal, mid and distal thigh. The vein is all enlarged = 8.3 mm;; L >2 sec reflux at distal thigh only. Maximal diameter 5.2 mm proximal.).   Right and Left SSV: PATENT FLOW NO REFLUX    support stockings wanted  Allergies  Allergen Reactions  . Tetracyclines & Related Rash  . Codeine     Current Outpatient Prescriptions  Medication Sig Dispense Refill  . aspirin 81 MG tablet Take 81 mg by mouth daily.      . carvedilol (COREG) 12.5 MG tablet Take 1 tablet (12.5 mg total) by mouth 2 (two) times daily with a meal.  180 tablet  1  . cetirizine (ZYRTEC) 10 MG tablet Take 1 tablet (10 mg total) by mouth daily.  30 tablet  11  . citalopram (CELEXA) 20 MG tablet Take 20 mg by mouth daily.      . clopidogrel (PLAVIX) 75 MG tablet Take 1 tablet (75 mg total) by mouth daily.  90 tablet  1  . digoxin (LANOXIN) 0.125 MG tablet Take 1 tablet (0.125 mg total) by mouth daily.  90 tablet  3  . METHADONE HCL PO Take 60 mg by mouth.      . nitroGLYCERIN (NITROSTAT) 0.3 MG SL tablet Place 0.3 mg under the tongue every 5 (five) minutes as needed.      Marland Kitchen omeprazole (PRILOSEC OTC) 20 MG tablet Take 20 mg by mouth daily.      Marland Kitchen albuterol (PROVENTIL HFA;VENTOLIN HFA) 108 (90 BASE) MCG/ACT inhaler Inhale 2 puffs into the lungs every 6 (six) hours as needed for wheezing.  1 Inhaler  3  .  atorvastatin (LIPITOR) 40 MG tablet TAKE 1 TABLET BY MOUTH DAILY  30 tablet  11   No current facility-administered medications for this visit.   History   Social History Narrative   She is a divorced mother of 2, grandmother 2. She routinely exercises, does not all that much.   She is a former heavy smoker, who quit smoking in 2005 at the time her catastrophic cardiac events.   Former alcohol drinker, does not drink currently.   Former heroin abuser, now maintained on methadone the methadone clinic.   ROS: A comprehensive Review of Systems - Negative if not noted above. He is she still has mild swelling in the knees but dramatically  improved. No myalgias or arthralgias. Importantly she denies any aching or heaviness in her calves. No significant skin discoloration in her lower 70s. No neuropathic symptoms or restless leg to suggest significant venous insufficiency.  PHYSICAL EXAM BP 128/70  Pulse 64  Ht 5\' 1"  (1.549 m)  Wt 120 lb (54.432 kg)  BMI 22.69 kg/m2 General appearance: Thin/frail-appearing woman who appears older than her she did age, but stable. She is pleasant and in no acute distress. Answers questions properly. Neck: no adenopathy, no carotid bruit, no JVD and supple, symmetrical, trachea midline  Lungs: clear t CT of the, normal percussion bilaterally and With mild interstitial sounds. Nonlabored, good air movement  Heart: regular rate and rhythm, S1, S2 normal, no murmur, click, rub or gallop and normal apical impulse  Abdomen: soft, non-tender; bowel sounds normal; no masses, no organomegaly  Extremities: Bilateral knees still have mild swelling, but no more ecchymosis noted. No distal edema. Pulses: Diminished DP and PT pulses bilaterally.  WGN:FAOZHYQMV today: No  Recent Labs: Reviewed in Epic from October. Mildly reduced hemoglobin from 13.5 down to 11.1.  ASSESSMENT / PLAN: Joint swelling of lower leg - with bruising There is a finding that I am not really sure  what caused it. One possible explanation would be perforator veins from mid dilated greater saphenous veins into the deep veins that potentially ruptured leading to ecchymosis. It would be unusual for this to occur at both levels, unless for some reason she have descent of activity which strain both legs.  Plan for now is watchful waiting. She were compression stockings that are to be below the knee to avoid any compression above the knee where this could have happened. As long as the swelling is not bothering her too much and she is not having any of the venous insufficiency symptoms, I think expected management is probably best for her. If she does start having symptoms of referred to Dr. Arbie Cookey from vascular surgery.  Ischemic cardiomyopathy R. relatively stable regimen. It appears that her primary provider has reduced her carvedilol dose to to lower blood pressures and restarted digoxin which is a reasonable plan. She is not on an ACE inhibitor, and has not required diuretic. She has not really had any heart failure problem since. I would be inclined to follow her symptoms medically, but will review with my Electrophysiology colleagues as to whether or not she would warrant consideration for possible ICD. We can discuss this in her followup visit since her cardiac issues have been stable.  CAD (coronary artery disease) - CABG in 2005 --> none STEMI followed by stenting with extensive PCI to RCA as well as finger to the RCA. Also noted Cx disease with a stent that was patent, as  Relatively stable cardiac standpoint. Continue aspirin plus Plavix. I did discuss with her that if she were to have more bruising episodes it would be fine for her to stop the Plavix. She is on a low-dose beta blocker but not a standard low blood pressures. She is also on statin therapy.  With no active symptoms. There is no reason to pursue a noninvasive ischemic evaluation.  Hyperlipidemia She is on a statin. We will  followup her lipid panel and LFTs prior to her next visit.  Hypertension, essential Well-controlled on current regimen. I need to clarify whether she is taking the full dose of carvedilol or not.  ST elevation myocardial infarction (STEMI) of inferior wall - May 2005 with a large infarcts noted on Myoview  She is an extensive scar noted on Myoview, simply because of the extent of interventions that have had to be done on her including the essentially failed cadaveric vein CABG, I would be reluctant to do any invasive procedures on her unless forced by symptoms.  She is on good stable regimen. No further events since 2005.   Orders Placed This Encounter  Procedures  . Comprehensive metabolic panel    Order Specific Question:  Has the patient fasted?    Answer:  Yes  . Lipid panel    Order Specific Question:  Has the patient fasted?    Answer:  Yes   No orders of the defined types were placed in this encounter.   Followup: 6 months  DAVID W. Herbie Baltimore, M.D., M.S. [facility] [address] [place], [place]  [address]  [phone] Pager # [phone]

## 2013-09-08 NOTE — Assessment & Plan Note (Signed)
She is an extensive scar noted on Myoview, simply because of the extent of interventions that have had to be done on her including the essentially failed cadaveric vein CABG, I would be reluctant to do any invasive procedures on her unless forced by symptoms.  She is on good stable regimen. No further events since 2005.

## 2013-09-08 NOTE — Assessment & Plan Note (Signed)
There is a finding that I am not really sure what caused it. One possible explanation would be perforator veins from mid dilated greater saphenous veins into the deep veins that potentially ruptured leading to ecchymosis. It would be unusual for this to occur at both levels, unless for some reason she have descent of activity which strain both legs.  Plan for now is watchful waiting. She were compression stockings that are to be below the knee to avoid any compression above the knee where this could have happened. As long as the swelling is not bothering her too much and she is not having any of the venous insufficiency symptoms, I think expected management is probably best for her. If she does start having symptoms of referred to Dr. Arbie Cookey from vascular surgery.

## 2013-09-08 NOTE — Assessment & Plan Note (Signed)
Well-controlled on current regimen. I need to clarify whether she is taking the full dose of carvedilol or not.

## 2013-09-08 NOTE — Assessment & Plan Note (Signed)
Relatively stable cardiac standpoint. Continue aspirin plus Plavix. I did discuss with her that if she were to have more bruising episodes it would be fine for her to stop the Plavix. She is on a low-dose beta blocker but not a standard low blood pressures. She is also on statin therapy.  With no active symptoms. There is no reason to pursue a noninvasive ischemic evaluation.

## 2013-09-08 NOTE — Assessment & Plan Note (Signed)
R. relatively stable regimen. It appears that her primary provider has reduced her carvedilol dose to to lower blood pressures and restarted digoxin which is a reasonable plan. She is not on an ACE inhibitor, and has not required diuretic. She has not really had any heart failure problem since. I would be inclined to follow her symptoms medically, but will review with my Electrophysiology colleagues as to whether or not she would warrant consideration for possible ICD. We can discuss this in her followup visit since her cardiac issues have been stable.

## 2013-10-05 ENCOUNTER — Other Ambulatory Visit: Payer: Self-pay | Admitting: Cardiology

## 2013-10-05 NOTE — Telephone Encounter (Signed)
Rx was sent to pharmacy electronically. 

## 2014-04-17 ENCOUNTER — Other Ambulatory Visit: Payer: Self-pay | Admitting: Family Medicine

## 2014-05-15 ENCOUNTER — Other Ambulatory Visit: Payer: Self-pay | Admitting: Cardiology

## 2014-05-15 NOTE — Telephone Encounter (Signed)
Rx was sent to pharmacy electronically. 

## 2014-06-27 ENCOUNTER — Other Ambulatory Visit: Payer: Self-pay | Admitting: Cardiology

## 2014-06-28 NOTE — Telephone Encounter (Signed)
Rx was sent to pharmacy electronically. 

## 2014-09-11 ENCOUNTER — Other Ambulatory Visit: Payer: Self-pay | Admitting: Cardiology

## 2014-09-12 ENCOUNTER — Other Ambulatory Visit: Payer: Self-pay | Admitting: Cardiology

## 2014-09-12 NOTE — Telephone Encounter (Signed)
Rx was sent to pharmacy electronically. 

## 2014-10-16 ENCOUNTER — Telehealth: Payer: Self-pay | Admitting: Cardiology

## 2014-10-16 NOTE — Telephone Encounter (Signed)
Closed encounter °

## 2014-10-18 ENCOUNTER — Telehealth: Payer: Self-pay | Admitting: Cardiology

## 2014-10-18 MED ORDER — ATORVASTATIN CALCIUM 40 MG PO TABS: 40.0000 mg | ORAL_TABLET | Freq: Every day | ORAL | Status: AC

## 2014-10-18 NOTE — Telephone Encounter (Signed)
Pt called in stating that she she will need a refill on her Atorvastatin called in to the [facility] on [address]. Please call  Thanks

## 2014-10-18 NOTE — Telephone Encounter (Signed)
Spoke with pt, aware script sent to the pharmacy. She has a follow up appt

## 2014-10-31 ENCOUNTER — Telehealth: Payer: Self-pay | Admitting: Cardiology

## 2014-10-31 NOTE — Telephone Encounter (Signed)
Spoke to paramedic Caryl Neverichard Matthew he said Dr.Harding did not have to call back.[facility] will be getting in touch with him.

## 2014-10-31 NOTE — Telephone Encounter (Signed)
Returned call to paramedic Ilean SkillStephanie Davis she wanted to speak to Dr.Harding.Stated pt passed away yesterday and she needed Dr.Harding to sign death certificate.Pt has no pcp.Dr.Harding out of office this afternoon.Will send message to him.

## 2014-10-31 NOTE — Telephone Encounter (Signed)
Cheryl CornfieldStephanie called in stating that the pt passed yesterday possibly around 6pm and she noticed that the pt saw DR.Herbie BaltimoreHarding often and wanted to speak with him to see if he would be will to sign the death certificate. Please call  Thanks

## 2014-11-02 NOTE — Telephone Encounter (Signed)
Signed a copy. DH

## 2014-11-04 DEATH — deceased

## 2014-11-16 ENCOUNTER — Other Ambulatory Visit: Payer: Self-pay | Admitting: Cardiology

## 2014-11-18 NOTE — Telephone Encounter (Signed)
Rx(s) sent to pharmacy electronically.  

## 2014-12-19 ENCOUNTER — Ambulatory Visit: Payer: Medicare Other | Admitting: Cardiology
